# Patient Record
Sex: Male | Born: 1990 | Race: White | Hispanic: No | Marital: Single | Smoking: Current every day smoker
Health system: Southern US, Community
[De-identification: ages and names within clinical notes are randomized; demographics above are authoritative.]

## PROBLEM LIST (undated history)

## (undated) DIAGNOSIS — F1010 Alcohol abuse, uncomplicated: Secondary | ICD-10-CM

## (undated) DIAGNOSIS — B182 Chronic viral hepatitis C: Secondary | ICD-10-CM

## (undated) DIAGNOSIS — M549 Dorsalgia, unspecified: Secondary | ICD-10-CM

## (undated) HISTORY — PX: NO PAST SURGERIES: SHX2092

## (undated) HISTORY — DX: Chronic viral hepatitis C: B18.2

---

## 2012-08-25 ENCOUNTER — Emergency Department (HOSPITAL_COMMUNITY)
Admission: EM | Admit: 2012-08-25 | Discharge: 2012-08-25 | Disposition: A | Discharge status: Home / Self Care | Payer: Self-pay | Attending: Emergency Medicine | Admitting: Emergency Medicine

## 2012-08-25 ENCOUNTER — Encounter (HOSPITAL_COMMUNITY): Payer: Self-pay | Admitting: Physical Medicine and Rehabilitation

## 2012-08-25 DIAGNOSIS — F17200 Nicotine dependence, unspecified, uncomplicated: Secondary | ICD-10-CM | POA: Insufficient documentation

## 2012-08-25 DIAGNOSIS — M545 Low back pain, unspecified: Secondary | ICD-10-CM | POA: Insufficient documentation

## 2012-08-25 DIAGNOSIS — Z79899 Other long term (current) drug therapy: Secondary | ICD-10-CM | POA: Insufficient documentation

## 2012-08-25 HISTORY — DX: Dorsalgia, unspecified: M54.9

## 2012-08-25 MED ORDER — METHOCARBAMOL 500 MG PO TABS: 500.0000 mg | ORAL_TABLET | Freq: Two times a day (BID) | ORAL | Status: DC

## 2012-08-25 MED ORDER — NAPROXEN 500 MG PO TABS: 500.0000 mg | ORAL_TABLET | Freq: Two times a day (BID) | ORAL | Status: DC

## 2012-08-25 MED ORDER — OXYCODONE-ACETAMINOPHEN 5-325 MG PO TABS: 1.0000 | ORAL_TABLET | Freq: Four times a day (QID) | ORAL | Status: DC | PRN

## 2012-08-25 NOTE — ED Provider Notes (Signed)
Medical screening examination/treatment/procedure(s) were performed by non-physician practitioner and as supervising physician I was immediately available for consultation/collaboration.   Charles B. Sheldon, MD 08/25/12 1345 

## 2012-08-25 NOTE — ED Notes (Addendum)
Patient states that he has been having shoulder pain that radiates down R side and into thigh area. Patient states he has been using ibuprofen/aleve/tylenol to relieve pain, but nothing has worked thus far.   Patient states that he has been trying both ice/heat to help with little relief of pain.

## 2012-08-25 NOTE — ED Notes (Signed)
Pt states pain to R middle back radiating up to shoulder and down to R hip/thigh. States chronic pain with back. States recent heavy lifting, thinks he could of strained muscles. 10/10 pain at the time. No obvious deformities noted. Pt is conscious alert and oriented x4.

## 2012-08-25 NOTE — ED Provider Notes (Signed)
CSN: [id number]     Arrival date & time 08/25/12  1055 History     First MD Initiated Contact with Patient 08/25/12 1114     Chief Complaint  Patient presents with  . Back Pain   (Consider location/radiation/quality/duration/timing/severity/associated sxs/prior Treatment) HPI Comments: Patient presents with lower back pain.  He reports that the pain has been recurrent over the past year.  Pain radiates to the right hip and down the right leg.  He reports that he does a lot of heavy lifting at work.  He reports that at times he feels the muscles on the right side of his back spasm.  He has been evaluated by a Chiropractor for this in the past and had "adjustments" performed.  He has been taking Ibuprofen and Aleve at home without relief.  Patient is a 21 y.o. male presenting with back pain. The history is provided by the patient.  Back Pain Worsened by:  Bending and ambulation Associated symptoms: no abdominal pain, no bladder incontinence, no bowel incontinence, no dysuria, no fever, no numbness, no perianal numbness, no tingling and no weakness     Past Medical History  Diagnosis Date  . Back pain    No past surgical history on file. History reviewed. No pertinent family history. History  Substance Use Topics  . Smoking status: Current Every Day Smoker    Types: Cigarettes  . Smokeless tobacco: Not on file  . Alcohol Use: Yes     Comment: social    Review of Systems  Constitutional: Negative for fever.  Gastrointestinal: Negative for abdominal pain and bowel incontinence.  Genitourinary: Negative for bladder incontinence and dysuria.  Musculoskeletal: Positive for back pain.  Neurological: Negative for tingling, weakness and numbness.  All other systems reviewed and are negative.    Allergies  Review of patient's allergies indicates no known allergies.  Home Medications   Current Outpatient Rx  Name  Route  Sig  Dispense  Refill  . acetaminophen (TYLENOL) 500 MG  tablet   Oral   Take 500 mg by mouth every 6 (six) hours as needed for pain.          Marland Kitchen ibuprofen (ADVIL,MOTRIN) 200 MG tablet   Oral   Take 400 mg by mouth every 6 (six) hours as needed for pain.          . Menthol, Topical Analgesic, (BIOFREEZE EX)   Apply externally   Apply 1 application topically daily as needed (pain).         Marland Kitchen menthol-zinc oxide (GOLD BOND) powder   Topical   Apply 1 application topically 2 (two) times daily as needed (pain).         . Multiple Vitamin (MULTIVITAMIN WITH MINERALS) TABS   Oral   Take 1 tablet by mouth daily.         . naproxen sodium (ANAPROX) 220 MG tablet   Oral   Take 220 mg by mouth 2 (two) times daily as needed (pain).           BP 117/69  Pulse 89  Temp(Src) 98.4 F (36.9 C)  Resp 18  Ht 6\' 1"  (1.854 m)  Wt 200 lb (90.719 kg)  BMI 26.39 kg/m2  SpO2 100% Physical Exam  Nursing note and vitals reviewed. Constitutional: He is oriented to person, place, and time. He appears well-developed and well-nourished. No distress.  HENT:  Head: Normocephalic and atraumatic.  Neck: Normal range of motion and full passive range of motion without  pain. Neck supple. No spinous process tenderness and no muscular tenderness present. No rigidity. Normal range of motion present.  Cardiovascular: Normal rate, regular rhythm, normal heart sounds and intact distal pulses.   Pulmonary/Chest: Effort normal and breath sounds normal.  Musculoskeletal:       Cervical back: He exhibits normal range of motion, no tenderness, no bony tenderness and no pain.       Thoracic back: He exhibits no tenderness, no bony tenderness and no pain.       Lumbar back: He exhibits tenderness, bony tenderness and pain. He exhibits no spasm and normal pulse.  Bilateral lower extremities nontender without color change, baseline range of motion of extremities with intact distal pulses.  Pt has increased pain w ROM of lumbar spine. Pain w ambulation, no sign of  ataxia.  Neurological: He is alert and oriented to person, place, and time. He has normal strength and normal reflexes. No sensory deficit. Gait (no ataxia, slowed and hunched d/t pain ) abnormal.  Sensation at baseline for light touch in all 4 distal extremities, motor symmetric & bilateral 5/5 (hips: abduction, adduction, flexion; knee: flexion & extension; foot: dorsiflexion, plantar flexion, toes: dorsi flexion) Patellar & ankle reflexes intact.   Skin: Skin is warm and dry. No rash noted. He is not diaphoretic. No erythema. No pallor.  Psychiatric: He has a normal mood and affect.    ED Course   Procedures (including critical care time)  Labs Reviewed - No data to display No results found. No diagnosis found.  MDM  Patient with back pain.  Pain appears to be muscular.  No neurological deficits and normal neuro exam.  Patient can walk but states is painful.  No loss of bowel or bladder control.  No concern for cauda equina.  No fever, night sweats, weight loss, h/o cancer, IVDU.  RICE protocol and pain medicine indicated and discussed with patient.    Pascal Lux [place], PA-C 08/25/12 1210

## 2012-09-02 ENCOUNTER — Emergency Department (HOSPITAL_COMMUNITY)
Admission: EM | Admit: 2012-09-02 | Discharge: 2012-09-02 | Discharge status: Against Medical Advice | Payer: Self-pay | Attending: Emergency Medicine | Admitting: Emergency Medicine

## 2012-09-02 ENCOUNTER — Encounter (HOSPITAL_COMMUNITY): Payer: Self-pay | Admitting: Emergency Medicine

## 2012-09-02 DIAGNOSIS — S4991XA Unspecified injury of right shoulder and upper arm, initial encounter: Secondary | ICD-10-CM

## 2012-09-02 DIAGNOSIS — X58XXXA Exposure to other specified factors, initial encounter: Secondary | ICD-10-CM | POA: Insufficient documentation

## 2012-09-02 DIAGNOSIS — S46909A Unspecified injury of unspecified muscle, fascia and tendon at shoulder and upper arm level, unspecified arm, initial encounter: Secondary | ICD-10-CM | POA: Insufficient documentation

## 2012-09-02 DIAGNOSIS — Y939 Activity, unspecified: Secondary | ICD-10-CM | POA: Insufficient documentation

## 2012-09-02 DIAGNOSIS — S4980XA Other specified injuries of shoulder and upper arm, unspecified arm, initial encounter: Secondary | ICD-10-CM | POA: Insufficient documentation

## 2012-09-02 DIAGNOSIS — Y929 Unspecified place or not applicable: Secondary | ICD-10-CM | POA: Insufficient documentation

## 2012-09-02 NOTE — ED Notes (Signed)
Pt leaving AMA after [employer] questioned and obtained pt's real name and DOB.

## 2012-09-02 NOTE — ED Notes (Signed)
Stephen Mcpherson    07-Oct-1990.   Pt's real name per [employer].

## 2012-09-02 NOTE — ED Notes (Signed)
States trying to move refrigerator and it fell on top of him, now having right shoulder pain and right back pain. States this happened two days ago; has tried gold bond creams, heat and ice with no relief to the shoulder.

## 2012-09-02 NOTE — ED Provider Notes (Signed)
Pt evaluated by triage and would not give his name. [employer] was sent in to question him. He was asked to give a urine sample. Pt gave a false name and decided to leave AMA this was prior to my examination although I did have a chance to briefly interview the patient. Vital signs stable, right shoulder injury from a few days ago.     Dorthula Matas, PA-C 09/02/12 1828

## 2012-09-03 NOTE — ED Provider Notes (Signed)
Medical screening examination/treatment/procedure(s) were performed by non-physician practitioner and as supervising physician I was immediately available for consultation/collaboration.   Travius Crochet H Shellia Hartl, MD 09/03/12 0005 

## 2012-09-18 ENCOUNTER — Encounter (HOSPITAL_COMMUNITY): Payer: Self-pay | Admitting: Physical Medicine and Rehabilitation

## 2014-09-13 ENCOUNTER — Encounter (HOSPITAL_COMMUNITY): Payer: Self-pay | Admitting: *Deleted

## 2014-09-13 ENCOUNTER — Emergency Department (HOSPITAL_COMMUNITY)
Admission: EM | Admit: 2014-09-13 | Discharge: 2014-09-14 | Disposition: A | Discharge status: Home / Self Care | Payer: Self-pay | Attending: Emergency Medicine | Admitting: Emergency Medicine

## 2014-09-13 DIAGNOSIS — K2970 Gastritis, unspecified, without bleeding: Secondary | ICD-10-CM | POA: Insufficient documentation

## 2014-09-13 DIAGNOSIS — R112 Nausea with vomiting, unspecified: Secondary | ICD-10-CM

## 2014-09-13 DIAGNOSIS — Z720 Tobacco use: Secondary | ICD-10-CM | POA: Insufficient documentation

## 2014-09-13 DIAGNOSIS — E860 Dehydration: Secondary | ICD-10-CM | POA: Insufficient documentation

## 2014-09-13 DIAGNOSIS — F1010 Alcohol abuse, uncomplicated: Secondary | ICD-10-CM | POA: Insufficient documentation

## 2014-09-13 DIAGNOSIS — R34 Anuria and oliguria: Secondary | ICD-10-CM | POA: Insufficient documentation

## 2014-09-13 LAB — CBC WITH DIFFERENTIAL/PLATELET
BASOS ABS: 0 10*3/uL (ref 0.0–0.1)
BASOS PCT: 0 % (ref 0–1)
EOS ABS: 0 10*3/uL (ref 0.0–0.7)
Eosinophils Relative: 0 % (ref 0–5)
HEMATOCRIT: 49.5 % (ref 39.0–52.0)
HEMOGLOBIN: 17.8 g/dL — AB (ref 13.0–17.0)
Lymphocytes Relative: 4 % — ABNORMAL LOW (ref 12–46)
Lymphs Abs: 0.6 10*3/uL — ABNORMAL LOW (ref 0.7–4.0)
MCH: 34.2 pg — ABNORMAL HIGH (ref 26.0–34.0)
MCHC: 36 g/dL (ref 30.0–36.0)
MCV: 95 fL (ref 78.0–100.0)
MONOS PCT: 7 % (ref 3–12)
Monocytes Absolute: 1 10*3/uL (ref 0.1–1.0)
NEUTROS ABS: 13 10*3/uL — AB (ref 1.7–7.7)
NEUTROS PCT: 89 % — AB (ref 43–77)
Platelets: 180 10*3/uL (ref 150–400)
RBC: 5.21 MIL/uL (ref 4.22–5.81)
RDW: 13.2 % (ref 11.5–15.5)
WBC: 14.6 10*3/uL — ABNORMAL HIGH (ref 4.0–10.5)

## 2014-09-13 LAB — COMPREHENSIVE METABOLIC PANEL
ALBUMIN: 5 g/dL (ref 3.5–5.0)
ALK PHOS: 49 U/L (ref 38–126)
ALT: 31 U/L (ref 17–63)
ANION GAP: 17 — AB (ref 5–15)
AST: 37 U/L (ref 15–41)
BILIRUBIN TOTAL: 1.9 mg/dL — AB (ref 0.3–1.2)
BUN: 12 mg/dL (ref 6–20)
CO2: 21 mmol/L — AB (ref 22–32)
Calcium: 10.4 mg/dL — ABNORMAL HIGH (ref 8.9–10.3)
Chloride: 102 mmol/L (ref 101–111)
Creatinine, Ser: 1.17 mg/dL (ref 0.61–1.24)
GFR calc Af Amer: >60 mL/min (ref >60)
GFR calc non Af Amer: >60 mL/min (ref >60)
GLUCOSE: 76 mg/dL (ref 65–99)
POTASSIUM: 4.1 mmol/L (ref 3.5–5.1)
SODIUM: 140 mmol/L (ref 135–145)
Total Protein: 8.6 g/dL — ABNORMAL HIGH (ref 6.5–8.1)

## 2014-09-13 LAB — ETHANOL: Alcohol, Ethyl (B): <5 mg/dL (ref <5)

## 2014-09-13 LAB — LIPASE, BLOOD: Lipase: 15 U/L — ABNORMAL LOW (ref 22–51)

## 2014-09-13 MED ORDER — SODIUM CHLORIDE 0.9 % IV BOLUS (SEPSIS)
1000.0000 mL | Freq: Once | INTRAVENOUS | Status: AC
  Administered 2014-09-13: 1000 mL via INTRAVENOUS

## 2014-09-13 MED ORDER — ONDANSETRON HCL 4 MG/2ML IJ SOLN
4.0000 mg | Freq: Once | INTRAMUSCULAR | Status: AC
  Administered 2014-09-13: 4 mg via INTRAVENOUS
  Filled 2014-09-13: qty 2

## 2014-09-13 NOTE — ED Notes (Signed)
Pt states he has been drinking alcohol for about 2.5 days. Pt c/o n/v x 6hrs. Pt states he is seeing "coffee grounds." Pt states he is going to start going to get help at [facility]. Pt doesn't want help getting off alcohol from [place].

## 2014-09-14 LAB — URINALYSIS, ROUTINE W REFLEX MICROSCOPIC
Glucose, UA: NEGATIVE mg/dL
Hgb urine dipstick: NEGATIVE
Ketones, ur: >80 mg/dL — AB
LEUKOCYTES UA: NEGATIVE
NITRITE: NEGATIVE
PROTEIN: NEGATIVE mg/dL
SPECIFIC GRAVITY, URINE: 1.025 (ref 1.005–1.030)
UROBILINOGEN UA: 0.2 mg/dL (ref 0.0–1.0)
pH: 6 (ref 5.0–8.0)

## 2014-09-14 MED ORDER — PANTOPRAZOLE SODIUM 40 MG PO TBEC
40.0000 mg | DELAYED_RELEASE_TABLET | Freq: Once | ORAL | Status: AC
  Administered 2014-09-14: 40 mg via ORAL
  Filled 2014-09-14: qty 1

## 2014-09-14 MED ORDER — SODIUM CHLORIDE 0.9 % IV BOLUS (SEPSIS)
1000.0000 mL | Freq: Once | INTRAVENOUS | Status: AC
  Administered 2014-09-14: 1000 mL via INTRAVENOUS

## 2014-09-14 MED ORDER — PANTOPRAZOLE SODIUM 20 MG PO TBEC: 20.0000 mg | DELAYED_RELEASE_TABLET | Freq: Every day | ORAL | Status: DC

## 2014-09-14 MED ORDER — PROMETHAZINE HCL 25 MG PO TABS: 25.0000 mg | ORAL_TABLET | Freq: Four times a day (QID) | ORAL | Status: DC | PRN

## 2014-09-14 NOTE — Discharge Instructions (Signed)
Nausea and Vomiting °Nausea is a sick feeling that often comes before throwing up (vomiting). Vomiting is a reflex where stomach contents come out of your mouth. Vomiting can cause severe loss of body fluids (dehydration). Children and elderly adults can become dehydrated quickly, especially if they also have diarrhea. Nausea and vomiting are symptoms of a condition or disease. It is important to find the cause of your symptoms. °CAUSES  °· Direct irritation of the stomach lining. This irritation can result from increased acid production (gastroesophageal reflux disease), infection, food poisoning, taking certain medicines (such as nonsteroidal anti-inflammatory drugs), alcohol use, or tobacco use. °· Signals from the brain. These signals could be caused by a headache, heat exposure, an inner ear disturbance, increased pressure in the brain from injury, infection, a tumor, or a concussion, pain, emotional stimulus, or metabolic problems. °· An obstruction in the gastrointestinal tract (bowel obstruction). °· Illnesses such as diabetes, hepatitis, gallbladder problems, appendicitis, kidney problems, cancer, sepsis, atypical symptoms of a heart attack, or eating disorders. °· Medical treatments such as chemotherapy and radiation. °· Receiving medicine that makes you sleep (general anesthetic) during surgery. °DIAGNOSIS °Your caregiver may ask for tests to be done if the problems do not improve after a few days. Tests may also be done if symptoms are severe or if the reason for the nausea and vomiting is not clear. Tests may include: °· Urine tests. °· Blood tests. °· Stool tests. °· Cultures (to look for evidence of infection). °· X-rays or other imaging studies. °Test results can help your caregiver make decisions about treatment or the need for additional tests. °TREATMENT °You need to stay well hydrated. Drink frequently but in small amounts. You may wish to drink water, sports drinks, clear broth, or eat frozen  ice pops or gelatin dessert to help stay hydrated. When you eat, eating slowly may help prevent nausea. There are also some antinausea medicines that may help prevent nausea. °HOME CARE INSTRUCTIONS  °· Take all medicine as directed by your caregiver. °· If you do not have an appetite, do not force yourself to eat. However, you must continue to drink fluids. °· If you have an appetite, eat a normal diet unless your caregiver tells you differently. °¨ Eat a variety of complex carbohydrates (rice, wheat, potatoes, bread), lean meats, yogurt, fruits, and vegetables. °¨ Avoid high-fat foods because they are more difficult to digest. °· Drink enough water and fluids to keep your urine clear or pale yellow. °· If you are dehydrated, ask your caregiver for specific rehydration instructions. Signs of dehydration may include: °¨ Severe thirst. °¨ Dry lips and mouth. °¨ Dizziness. °¨ Dark urine. °¨ Decreasing urine frequency and amount. °¨ Confusion. °¨ Rapid breathing or pulse. °SEEK IMMEDIATE MEDICAL CARE IF:  °· You have blood or brown flecks (like coffee grounds) in your vomit. °· You have black or bloody stools. °· You have a severe headache or stiff neck. °· You are confused. °· You have severe abdominal pain. °· You have chest pain or trouble breathing. °· You do not urinate at least once every 8 hours. °· You develop cold or clammy skin. °· You continue to vomit for longer than 24 to 48 hours. °· You have a fever. °MAKE SURE YOU:  °· Understand these instructions. °· Will watch your condition. °· Will get help right away if you are not doing well or get worse. °Document Released: 01/10/2005 Document Revised: 04/04/2011 Document Reviewed: 06/09/2010 °ExitCare® Patient Information ©2015 [employer]. This information is not intended   to replace advice given to you by your health care provider. Make sure you discuss any questions you have with your health care provider.  Alcohol Use Disorder Alcohol use disorder is a  mental disorder. It is not a one-time incident of heavy drinking. Alcohol use disorder is the excessive and uncontrollable use of alcohol over time that leads to problems with functioning in one or more areas of daily living. People with this disorder risk harming themselves and others when they drink to excess. Alcohol use disorder also can cause other mental disorders, such as mood and anxiety disorders, and serious physical problems. People with alcohol use disorder often misuse other drugs.  Alcohol use disorder is common and widespread. Some people with this disorder drink alcohol to cope with or escape from negative life events. Others drink to relieve chronic pain or symptoms of mental illness. People with a family history of alcohol use disorder are at higher risk of losing control and using alcohol to excess.  SYMPTOMS  Signs and symptoms of alcohol use disorder may include the following:   Consumption ofalcohol inlarger amounts or over a longer period of time than intended.  Multiple unsuccessful attempts to cutdown or control alcohol use.   A great deal of time spent obtaining alcohol, using alcohol, or recovering from the effects of alcohol (hangover).  A strong desire or urge to use alcohol (cravings).   Continued use of alcohol despite problems at work, school, or home because of alcohol use.   Continued use of alcohol despite problems in relationships because of alcohol use.  Continued use of alcohol in situations when it is physically hazardous, such as driving a car.  Continued use of alcohol despite awareness of a physical or psychological problem that is likely related to alcohol use. Physical problems related to alcohol use can involve the brain, heart, liver, stomach, and intestines. Psychological problems related to alcohol use include intoxication, depression, anxiety, psychosis, delirium, and dementia.   The need for increased amounts of alcohol to achieve the same  desired effect, or a decreased effect from the consumption of the same amount of alcohol (tolerance).  Withdrawal symptoms upon reducing or stopping alcohol use, or alcohol use to reduce or avoid withdrawal symptoms. Withdrawal symptoms include:  Racing heart.  Hand tremor.  Difficulty sleeping.  Nausea.  Vomiting.  Hallucinations.  Restlessness.  Seizures. DIAGNOSIS Alcohol use disorder is diagnosed through an assessment by your health care provider. Your health care provider may start by asking three or four questions to screen for excessive or problematic alcohol use. To confirm a diagnosis of alcohol use disorder, at least two symptoms must be present within a 11-month period. The severity of alcohol use disorder depends on the number of symptoms:  Mild--two or three.  Moderate--four or five.  Severe--six or more. Your health care provider may perform a physical exam or use results from lab tests to see if you have physical problems resulting from alcohol use. Your health care provider may refer you to a mental health professional for evaluation. TREATMENT  Some people with alcohol use disorder are able to reduce their alcohol use to low-risk levels. Some people with alcohol use disorder need to quit drinking alcohol. When necessary, mental health professionals with specialized training in substance use treatment can help. Your health care provider can help you decide how severe your alcohol use disorder is and what type of treatment you need. The following forms of treatment are available:   Detoxification. Detoxification involves  the use of prescription medicines to prevent alcohol withdrawal symptoms in the first week after quitting. This is important for people with a history of symptoms of withdrawal and for heavy drinkers who are likely to have withdrawal symptoms. Alcohol withdrawal can be dangerous and, in severe cases, cause death. Detoxification is usually provided in a  hospital or in-patient substance use treatment facility.  Counseling or talk therapy. Talk therapy is provided by substance use treatment counselors. It addresses the reasons people use alcohol and ways to keep them from drinking again. The goals of talk therapy are to help people with alcohol use disorder find healthy activities and ways to cope with life stress, to identify and avoid triggers for alcohol use, and to handle cravings, which can cause relapse.  Medicines.Different medicines can help treat alcohol use disorder through the following actions:  Decrease alcohol cravings.  Decrease the positive reward response felt from alcohol use.  Produce an uncomfortable physical reaction when alcohol is used (aversion therapy).  Support groups. Support groups are run by people who have quit drinking. They provide emotional support, advice, and guidance. These forms of treatment are often combined. Some people with alcohol use disorder benefit from intensive combination treatment provided by specialized substance use treatment centers. Both inpatient and outpatient treatment programs are available. Document Released: 02/18/2004 Document Revised: 05/27/2013 Document Reviewed: 04/19/2012 [facility] Patient Information 2015 [place], [place]. This information is not intended to replace advice given to you by your health care provider. Make sure you discuss any questions you have with your health care provider.   Behavioral Health Resources in the [facility]  Intensive Outpatient Programs: [facility]      [address] [place], [place] [phone] Both a day and evening program       [facility] Outpatient     [address]        [place], [place] [address] [phone]         [facility] [address] [place] [phone]  [facility] Mental Health ACCESS LINE: [phone] or [phone] [address] [place], [place] [address] [email or web address]  Mobile Crisis Teams:                                        [facility]         Mobile Crisis Care Unit [phone]             [facility] [address] Ginette Otto [phone]                                         [facility] [address] [place] [phone]  Self-Help/Support Groups: [facility] [employer] of support groups [phone] (call for more info)  Narcotics Anonymous (NA) [facility] [address] [place] - 2 meetings at this location  Residential Treatment Programs:  [facility]      [address]        [place]       [phone]         [facility] [address], [place] [address] [place], [place]  [address] [phone]  [facility]  [address] [place]  [place], [place] [address] [phone] Admissions: 8am-3pm M-F  [facility]     [address]        [place], [place] [address]       [phone]         [facility] [address] [place], [place] [phone]  [facility] [address] [place], [place] [phone]  [facility] - Intensive Outpatient      [address]     [place], [place]       [phone]         [facility] ([facility]     [address] [place], [place] [phone] or [phone]  [facility] ([facility])  [address] [place], [place] [phone]  [facility] [address]                                               [address] [place] [phone]  [facility] Resources: [phone]               General Therapy                                                Angie Fava, PhD        [address] [place], [place] [address]         [phone]   Insurance  [facility]   [address] [place], [place] [address] [phone]  [facility] [address] [place], [place] [address] [phone] Insurance/Medicaid/sponsorship through [facility]                                              [address]                                        [place], [place] [address]    Therapy/tele-psych/case         [phone]          [facility] [address], [place]  [address]  Adolescent/group home/case management [phone]                                           Creola Corn PhD       General therapy       Insurance   [phone]         Dr. Lolly Mustache Insurance [phone] M-F  [facility] Medicaid, sponsorship [phone]

## 2014-09-14 NOTE — ED Provider Notes (Signed)
CSN: [id number]     Arrival date & time 09/13/14  2036 History   First MD Initiated Contact with Patient 09/13/14 2147     Chief Complaint  Patient presents with  . Emesis     (Consider location/radiation/quality/duration/timing/severity/associated sxs/prior Treatment) The history is provided by the patient.   Stephen Mcpherson is a 24 y.o. male who presents for evaluation of intractable vomiting and reporting coffee-ground emesis without seen bright red blood, symptoms started around 1 PM this afternoon.  He has a history of binge drinking and reports has had essentially no intake except for alcohol for the past 2 and half days, last intake was last night.  His been unable to tolerate any by mouth intake today.  He reports binging in similar fashion every several weeks since the beginning of the year.  He reports generalized headache and fatigue, denies dizziness or focal weakness.  He has had no diarrhea, last bowel movement was yesterday and normal and has had no blood in his stool.  He does endorse decreased urinary frequency today.    Past Medical History  Diagnosis Date  . Back pain    History reviewed. No pertinent past surgical history. History reviewed. No pertinent family history. Social History  Substance Use Topics  . Smoking status: Current Every Day Smoker -- 0.50 packs/day    Types: Cigarettes  . Smokeless tobacco: None  . Alcohol Use: Yes     Comment: social    Review of Systems  Constitutional: Positive for fatigue. Negative for fever.  HENT: Negative for congestion and sore throat.   Eyes: Negative.   Respiratory: Negative for chest tightness and shortness of breath.   Cardiovascular: Negative for chest pain.  Gastrointestinal: Positive for nausea and vomiting. Negative for abdominal pain and blood in stool.  Genitourinary: Positive for decreased urine volume. Negative for frequency.  Musculoskeletal: Negative for joint swelling, arthralgias and neck pain.   Skin: Negative.  Negative for rash and wound.  Neurological: Negative for dizziness, weakness, light-headedness, numbness and headaches.  Psychiatric/Behavioral: Negative.       Allergies  Review of patient's allergies indicates no known allergies.  Home Medications   Prior to Admission medications   Medication Sig Start Date End Date Taking? Authorizing Provider  acetaminophen (TYLENOL) 500 MG tablet Take 500 mg by mouth every 6 (six) hours as needed for pain.    Yes Historical Provider, MD  pantoprazole (PROTONIX) 20 MG tablet Take 1 tablet (20 mg total) by mouth daily. 09/14/14   Burgess Amor, PA-C  promethazine (PHENERGAN) 25 MG tablet Take 1 tablet (25 mg total) by mouth every 6 (six) hours as needed for nausea or vomiting. 09/14/14   Burgess Amor, PA-C   BP 122/76 mmHg  Pulse 75  Temp(Src) 97.8 F (36.6 C) (Oral)  Resp 20  Ht 6\' 1"  (1.854 m)  Wt 200 lb (90.719 kg)  BMI 26.39 kg/m2  SpO2 100% Physical Exam  Constitutional: He appears well-developed and well-nourished.  HENT:  Head: Normocephalic and atraumatic.  Eyes: Conjunctivae are normal.  Neck: Normal range of motion.  Cardiovascular: Normal rate, regular rhythm, normal heart sounds and intact distal pulses.   Pulmonary/Chest: Effort normal and breath sounds normal. He has no wheezes.  Abdominal: Soft. Bowel sounds are normal. He exhibits no distension. There is tenderness in the epigastric area. There is no guarding.  Musculoskeletal: Normal range of motion.  Neurological: He is alert.  Skin: Skin is warm and dry.  Psychiatric: He has a  normal mood and affect.  Nursing note and vitals reviewed.   ED Course  Procedures (including critical care time) Labs Review Labs Reviewed  CBC WITH DIFFERENTIAL/PLATELET - Abnormal; Notable for the following:    WBC 14.6 (*)    Hemoglobin 17.8 (*)    MCH 34.2 (*)    Neutrophils Relative % 89 (*)    Neutro Abs 13.0 (*)    Lymphocytes Relative 4 (*)    Lymphs Abs 0.6 (*)     All other components within normal limits  COMPREHENSIVE METABOLIC PANEL - Abnormal; Notable for the following:    CO2 21 (*)    Calcium 10.4 (*)    Total Protein 8.6 (*)    Total Bilirubin 1.9 (*)    Anion gap 17 (*)    All other components within normal limits  LIPASE, BLOOD - Abnormal; Notable for the following:    Lipase 15 (*)    All other components within normal limits  URINALYSIS, ROUTINE W REFLEX MICROSCOPIC (NOT AT [facility]) - Abnormal; Notable for the following:    Bilirubin Urine SMALL (*)    Ketones, ur >80 (*)    All other components within normal limits  ETHANOL    Imaging Review No results found.  EKG Interpretation None      MDM   Final diagnoses:  ETOH abuse  Dehydration  Non-intractable vomiting with nausea, vomiting of unspecified type  Gastritis    Patients labs reviewed, interpreted and considered during the medical decision making and disposition process.  Results were also discussed with patient.   Pt was given IV fluids 2 L and also maintained PO fluid intake.  Protonix given, zofran given.  Reexam with no acute abdominal findings, epigastric discomfort improved.  He states he feels hungry at this time.  Also discussed resources for assistance with alcohol abuse.  Patient is interested in taking this step and resources were provided him.  He was prescribed Protonix.  Referrals also given for obtaining PCP.  Parent follow-up anticipated here.  The patient appears reasonably screened and/or stabilized for discharge and I doubt any other medical condition or other [facility] requiring further screening, evaluation, or treatment in the ED at this time prior to discharge.      Burgess Amor, PA-C 09/14/14 0117  Burgess Amor, PA-C 09/14/14 0123  Benjiman Core, MD 09/14/14 1535

## 2015-05-01 ENCOUNTER — Encounter (HOSPITAL_BASED_OUTPATIENT_CLINIC_OR_DEPARTMENT_OTHER): Payer: Self-pay | Admitting: *Deleted

## 2015-05-01 ENCOUNTER — Emergency Department (HOSPITAL_BASED_OUTPATIENT_CLINIC_OR_DEPARTMENT_OTHER): Payer: Self-pay

## 2015-05-01 ENCOUNTER — Emergency Department (HOSPITAL_BASED_OUTPATIENT_CLINIC_OR_DEPARTMENT_OTHER)
Admission: EM | Admit: 2015-05-01 | Discharge: 2015-05-01 | Disposition: A | Discharge status: Home / Self Care | Payer: Self-pay | Attending: Emergency Medicine | Admitting: Emergency Medicine

## 2015-05-01 DIAGNOSIS — X58XXXA Exposure to other specified factors, initial encounter: Secondary | ICD-10-CM | POA: Insufficient documentation

## 2015-05-01 DIAGNOSIS — Y998 Other external cause status: Secondary | ICD-10-CM | POA: Insufficient documentation

## 2015-05-01 DIAGNOSIS — Y9289 Other specified places as the place of occurrence of the external cause: Secondary | ICD-10-CM | POA: Insufficient documentation

## 2015-05-01 DIAGNOSIS — R569 Unspecified convulsions: Secondary | ICD-10-CM | POA: Insufficient documentation

## 2015-05-01 DIAGNOSIS — R51 Headache: Secondary | ICD-10-CM | POA: Insufficient documentation

## 2015-05-01 DIAGNOSIS — F17210 Nicotine dependence, cigarettes, uncomplicated: Secondary | ICD-10-CM | POA: Insufficient documentation

## 2015-05-01 DIAGNOSIS — Y9389 Activity, other specified: Secondary | ICD-10-CM | POA: Insufficient documentation

## 2015-05-01 DIAGNOSIS — S01552A Open bite of oral cavity, initial encounter: Secondary | ICD-10-CM | POA: Insufficient documentation

## 2015-05-01 HISTORY — DX: Alcohol abuse, uncomplicated: F10.10

## 2015-05-01 LAB — URINALYSIS, ROUTINE W REFLEX MICROSCOPIC
Bilirubin Urine: NEGATIVE
Glucose, UA: NEGATIVE mg/dL
Hgb urine dipstick: NEGATIVE
KETONES UR: 15 mg/dL — AB
Leukocytes, UA: NEGATIVE
NITRITE: NEGATIVE
Protein, ur: 30 mg/dL — AB
SPECIFIC GRAVITY, URINE: 1.017 (ref 1.005–1.030)
pH: 7 (ref 5.0–8.0)

## 2015-05-01 LAB — I-STAT VENOUS BLOOD GAS, ED
ACID-BASE DEFICIT: 2 mmol/L (ref 0.0–2.0)
BICARBONATE: 21 meq/L (ref 20.0–24.0)
O2 SAT: 98 %
PCO2 VEN: 29.3 mmHg — AB (ref 45.0–50.0)
PH VEN: 7.462 — AB (ref 7.250–7.300)
PO2 VEN: 87 mmHg — AB (ref 31.0–45.0)
Patient temperature: 97.8
TCO2: 22 mmol/L (ref 0–100)

## 2015-05-01 LAB — BASIC METABOLIC PANEL
Anion gap: >20 — ABNORMAL HIGH (ref 5–15)
Anion gap: 9 (ref 5–15)
BUN: 8 mg/dL (ref 6–20)
BUN: 6 mg/dL (ref 6–20)
CHLORIDE: 112 mmol/L — AB (ref 101–111)
CO2: 11 mmol/L — ABNORMAL LOW (ref 22–32)
CO2: 22 mmol/L (ref 22–32)
CREATININE: 1.22 mg/dL (ref 0.61–1.24)
Calcium: 9.5 mg/dL (ref 8.9–10.3)
Calcium: 8.4 mg/dL — ABNORMAL LOW (ref 8.9–10.3)
Chloride: 107 mmol/L (ref 101–111)
Creatinine, Ser: 0.8 mg/dL (ref 0.61–1.24)
GFR calc Af Amer: >60 mL/min (ref >60)
GFR calc Af Amer: >60 mL/min (ref >60)
GFR calc non Af Amer: >60 mL/min (ref >60)
Glucose, Bld: 128 mg/dL — ABNORMAL HIGH (ref 65–99)
Glucose, Bld: 101 mg/dL — ABNORMAL HIGH (ref 65–99)
POTASSIUM: 3.2 mmol/L — AB (ref 3.5–5.1)
POTASSIUM: 2.9 mmol/L — AB (ref 3.5–5.1)
SODIUM: 143 mmol/L (ref 135–145)
Sodium: 145 mmol/L (ref 135–145)

## 2015-05-01 LAB — RAPID URINE DRUG SCREEN, HOSP PERFORMED
AMPHETAMINES: POSITIVE — AB
Barbiturates: NOT DETECTED
Benzodiazepines: NOT DETECTED
Cocaine: NOT DETECTED
Opiates: NOT DETECTED
Tetrahydrocannabinol: POSITIVE — AB

## 2015-05-01 LAB — MAGNESIUM: MAGNESIUM: 2.4 mg/dL (ref 1.7–2.4)

## 2015-05-01 LAB — CBC
HCT: 51.4 % (ref 39.0–52.0)
Hemoglobin: 17 g/dL (ref 13.0–17.0)
MCH: 33.7 pg (ref 26.0–34.0)
MCHC: 33.1 g/dL (ref 30.0–36.0)
MCV: 102 fL — AB (ref 78.0–100.0)
PLATELETS: 201 10*3/uL (ref 150–400)
RBC: 5.04 MIL/uL (ref 4.22–5.81)
RDW: 14.9 % (ref 11.5–15.5)
WBC: 11.3 10*3/uL — AB (ref 4.0–10.5)

## 2015-05-01 LAB — URINE MICROSCOPIC-ADD ON: RBC / HPF: NONE SEEN RBC/hpf (ref 0–5)

## 2015-05-01 LAB — CBG MONITORING, ED: GLUCOSE-CAPILLARY: 123 mg/dL — AB (ref 65–99)

## 2015-05-01 MED ORDER — KETOROLAC TROMETHAMINE 30 MG/ML IJ SOLN
30.0000 mg | Freq: Once | INTRAMUSCULAR | Status: AC
  Administered 2015-05-01: 30 mg via INTRAVENOUS
  Filled 2015-05-01: qty 1

## 2015-05-01 MED ORDER — ONDANSETRON HCL 4 MG/2ML IJ SOLN
4.0000 mg | Freq: Once | INTRAMUSCULAR | Status: AC
  Administered 2015-05-01: 4 mg via INTRAVENOUS
  Filled 2015-05-01: qty 2

## 2015-05-01 MED ORDER — POTASSIUM CHLORIDE CRYS ER 20 MEQ PO TBCR: 20.0000 meq | EXTENDED_RELEASE_TABLET | Freq: Two times a day (BID) | ORAL | Status: DC

## 2015-05-01 MED ORDER — GI COCKTAIL ~~LOC~~: 30.0000 mL | Freq: Once | ORAL | Status: DC

## 2015-05-01 MED ORDER — SODIUM CHLORIDE 0.9 % IV SOLN: 2000.0000 mg | Freq: Once | INTRAVENOUS | Status: DC

## 2015-05-01 MED ORDER — GI COCKTAIL ~~LOC~~
30.0000 mL | Freq: Once | ORAL | Status: DC
Start: 2015-05-01 — End: 2015-05-02

## 2015-05-01 MED ORDER — SODIUM CHLORIDE 0.9 % IV BOLUS (SEPSIS)
2000.0000 mL | Freq: Once | INTRAVENOUS | Status: AC
  Administered 2015-05-01: 2000 mL via INTRAVENOUS

## 2015-05-01 MED ORDER — POTASSIUM CHLORIDE 10 MEQ/100ML IV SOLN
10.0000 meq | INTRAVENOUS | Status: AC
  Administered 2015-05-01 (×2): 10 meq via INTRAVENOUS
  Filled 2015-05-01 (×2): qty 100

## 2015-05-01 MED ORDER — RANITIDINE HCL 150 MG/10ML PO SYRP
150.0000 mg | ORAL_SOLUTION | Freq: Once | ORAL | Status: AC
  Administered 2015-05-01: 150 mg via ORAL
  Filled 2015-05-01: qty 10

## 2015-05-01 MED ORDER — POTASSIUM CHLORIDE CRYS ER 20 MEQ PO TBCR
40.0000 meq | EXTENDED_RELEASE_TABLET | Freq: Once | ORAL | Status: AC
  Administered 2015-05-01: 40 meq via ORAL
  Filled 2015-05-01: qty 2

## 2015-05-01 MED ORDER — LEVETIRACETAM 500 MG/5ML IV SOLN
INTRAVENOUS | Status: AC
  Administered 2015-05-01: 500 mg
  Filled 2015-05-01: qty 20

## 2015-05-01 MED ORDER — LORAZEPAM 2 MG/ML IJ SOLN
INTRAMUSCULAR | Status: AC
  Filled 2015-05-01: qty 1

## 2015-05-01 MED ORDER — LEVETIRACETAM 500 MG PO TABS: 500.0000 mg | ORAL_TABLET | Freq: Two times a day (BID) | ORAL | Status: DC

## 2015-05-01 NOTE — ED Notes (Signed)
Pt postictal on arrival to ED.  Pt combative.  Reports that pt had 2 witnessed seizures that lasted 1.5 minutes each en route to hospital.  States that pt had a migraine all morning.  Drank ETOH very heavily last night.
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
## 2015-05-01 NOTE — ED Provider Notes (Signed)
CSN: [id number]     Arrival date & time 05/01/15  1459 History   First MD Initiated Contact with Patient 05/01/15 1521     Chief Complaint  Patient presents with  . Seizures     (Consider location/radiation/quality/duration/timing/severity/associated sxs/prior Treatment) Patient is a 25 y.o. male presenting with seizures.  Seizures Seizure activity on arrival: no   Seizure type:  Grand mal Preceding symptoms: aura and headache   Initial focality:  None Episode characteristics: abnormal movements and unresponsiveness   Postictal symptoms: no confusion   Return to baseline: no   Severity:  Mild Duration:  2 minutes Timing:  Once Recent head injury:  No recent head injuries PTA treatment:  None History of seizures: yes     Past Medical History  Diagnosis Date  . Back pain   . ETOH abuse    History reviewed. No pertinent past surgical history. History reviewed. No pertinent family history. Social History  Substance Use Topics  . Smoking status: Current Every Day Smoker -- 0.50 packs/day    Types: Cigarettes  . Smokeless tobacco: None  . Alcohol Use: Yes     Comment: social    Review of Systems  Neurological: Positive for seizures and headaches. Negative for syncope and weakness.  All other systems reviewed and are negative.     Allergies  Review of patient's allergies indicates no known allergies.  Home Medications   Prior to Admission medications   Medication Sig Start Date End Date Taking? Authorizing Provider  levETIRAcetam (KEPPRA) 500 MG tablet Take 1 tablet (500 mg total) by mouth 2 (two) times daily. 05/01/15   Marily Memos, MD  potassium chloride SA (K-DUR,KLOR-CON) 20 MEQ tablet Take 1 tablet (20 mEq total) by mouth 2 (two) times daily. 05/01/15   Marily Memos, MD   BP 139/84 mmHg  Pulse 81  Temp(Src) 97.9 F (36.6 C) (Oral)  Resp 17  Ht 6' (1.829 m)  Wt 210 lb (95.255 kg)  BMI 28.47 kg/m2  SpO2 100% Physical Exam  Constitutional: He is oriented  to person, place, and time. He appears well-developed and well-nourished.  HENT:  Head: Normocephalic and atraumatic.  Lateral bite marks on tongue  Neck: Normal range of motion.  Cardiovascular: Normal rate.   Pulmonary/Chest: Effort normal. No respiratory distress.  Abdominal: Soft. He exhibits no distension.  Musculoskeletal: Normal range of motion. He exhibits no edema or tenderness.  Neurological: He is alert and oriented to person, place, and time. No cranial nerve deficit.  No altered mental status, able to give full seemingly accurate history.  Face is symmetric, EOM's intact, pupils equal and reactive, vision intact, tongue and uvula midline without deviation Upper and Lower extremity motor 5/5, intact pain perception in distal extremities, 2+ reflexes in biceps, patella and achilles tendons. Finger to nose normal, heel to shin normal.  Skin: Skin is warm and dry. No rash noted. No erythema.  Nursing note and vitals reviewed.   ED Course  Procedures (including critical care time) Labs Review Labs Reviewed  BASIC METABOLIC PANEL - Abnormal; Notable for the following:    Potassium 3.2 (*)    CO2 11 (*)    Glucose, Bld 128 (*)    Anion gap >20 (*)    All other components within normal limits  URINE RAPID DRUG SCREEN, HOSP PERFORMED - Abnormal; Notable for the following:    Amphetamines POSITIVE (*)    Tetrahydrocannabinol POSITIVE (*)    All other components within normal limits  URINALYSIS, ROUTINE  W REFLEX MICROSCOPIC (NOT AT [facility]) - Abnormal; Notable for the following:    Ketones, ur 15 (*)    Protein, ur 30 (*)    All other components within normal limits  CBC - Abnormal; Notable for the following:    WBC 11.3 (*)    MCV 102.0 (*)    All other components within normal limits  URINE MICROSCOPIC-ADD ON - Abnormal; Notable for the following:    Squamous Epithelial / LPF 0-5 (*)    Bacteria, UA FEW (*)    All other components within normal limits  BASIC METABOLIC  PANEL - Abnormal; Notable for the following:    Potassium 2.9 (*)    Chloride 112 (*)    Glucose, Bld 101 (*)    Calcium 8.4 (*)    All other components within normal limits  CBG MONITORING, ED - Abnormal; Notable for the following:    Glucose-Capillary 123 (*)    All other components within normal limits  I-STAT VENOUS BLOOD GAS, ED - Abnormal; Notable for the following:    pH, Ven 7.462 (*)    pCO2, Ven 29.3 (*)    pO2, Ven 87.0 (*)    All other components within normal limits  MAGNESIUM  CBG MONITORING, ED    Imaging Review Ct Head Wo Contrast  05/01/2015  CLINICAL DATA:  25 year old male with multiple seizures in the last 24 hours. Subsequent headache, dizziness and nausea. Personal history of seizure disorder, but no seizures for "Years" . Initial encounter. EXAM: CT HEAD WITHOUT CONTRAST TECHNIQUE: Contiguous axial images were obtained from the base of the skull through the vertex without intravenous contrast. COMPARISON:  None. FINDINGS: Visualized paranasal sinuses and mastoids are clear. No acute osseous abnormality identified. Visualized orbits and scalp soft tissues are within normal limits. Cerebral volume is normal. No midline shift, ventriculomegaly, mass effect, evidence of mass lesion, intracranial hemorrhage or evidence of cortically based acute infarction. Gray-white matter differentiation is within normal limits throughout the brain. Dominant distal left vertebral artery. No suspicious intracranial vascular hyperdensity. IMPRESSION: Normal non contrast appearance of the brain. No acute traumatic injury identified. Electronically Signed   By: Odessa FlemingH  Hall M.D.   On: 05/01/2015 16:02   I have personally reviewed and evaluated these images and lab results as part of my medical decision-making.   EKG Interpretation   Date/Time:  Friday May 01 2015 15:04:47 EDT Ventricular Rate:  102 PR Interval:  172 QRS Duration: 108 QT Interval:  387 QTC Calculation: 504 R Axis:    109 Text Interpretation:  Sinus tachycardia Borderline right axis deviation  Prolonged QT interval Confirmed by [facility]ES[facility] MD, Barbara CowerJASON [phone]) on 05/01/2015  4:55:43 PM      MDM   Final diagnoses:  Seizure (HCC)   Multiple seizures today. H/o same. Not on meds. Had a headache ahead of time. Drank beer last night.  Possible hyponatremia, tumor.  Slightly acidotic, likely 2/2 dehydration, improved with hydration. Potassium decreased likely 2/2 dilutional effect.  Discussed case with Neurology who recommended starting keppra and following up as an outpatient. Neuro exam unchanged.   Will also get K rechecked.   New Prescriptions: Discharge Medication List as of 05/01/2015  8:50 PM    START taking these medications   Details  levETIRAcetam (KEPPRA) 500 MG tablet Take 1 tablet (500 mg total) by mouth 2 (two) times daily., Starting 05/01/2015, Until Discontinued, Print    potassium chloride SA (K-DUR,KLOR-CON) 20 MEQ tablet Take 1 tablet (20 mEq total) by  mouth 2 (two) times daily., Starting 05/01/2015, Until Discontinued, Print        I have personally and contemperaneously reviewed labs and imaging and used in my decision making as above.   A medical screening exam was performed and I feel the patient has had an appropriate workup for their chief complaint at this time and likelihood of emergent condition existing is low. Their vital signs are stable. They have been counseled on decision, discharge, follow up and which symptoms necessitate immediate return to the emergency department.  They verbally stated understanding and agreement with plan and discharged in stable condition.      Marily Memos, MD 05/02/15 [phone]

## 2015-06-14 ENCOUNTER — Encounter (HOSPITAL_COMMUNITY): Payer: Self-pay | Admitting: Emergency Medicine

## 2015-06-14 ENCOUNTER — Emergency Department (HOSPITAL_COMMUNITY)
Admission: EM | Admit: 2015-06-14 | Discharge: 2015-06-15 | Disposition: A | Discharge status: Home / Self Care | Payer: No Typology Code available for payment source | Attending: Emergency Medicine | Admitting: Emergency Medicine

## 2015-06-14 ENCOUNTER — Emergency Department (HOSPITAL_COMMUNITY): Payer: No Typology Code available for payment source

## 2015-06-14 DIAGNOSIS — S29001A Unspecified injury of muscle and tendon of front wall of thorax, initial encounter: Secondary | ICD-10-CM | POA: Diagnosis not present

## 2015-06-14 DIAGNOSIS — Z79899 Other long term (current) drug therapy: Secondary | ICD-10-CM | POA: Insufficient documentation

## 2015-06-14 DIAGNOSIS — Y9389 Activity, other specified: Secondary | ICD-10-CM | POA: Diagnosis not present

## 2015-06-14 DIAGNOSIS — S8992XA Unspecified injury of left lower leg, initial encounter: Secondary | ICD-10-CM | POA: Diagnosis present

## 2015-06-14 DIAGNOSIS — S80811A Abrasion, right lower leg, initial encounter: Secondary | ICD-10-CM | POA: Insufficient documentation

## 2015-06-14 DIAGNOSIS — S299XXA Unspecified injury of thorax, initial encounter: Secondary | ICD-10-CM

## 2015-06-14 DIAGNOSIS — S99922A Unspecified injury of left foot, initial encounter: Secondary | ICD-10-CM | POA: Insufficient documentation

## 2015-06-14 DIAGNOSIS — S80812A Abrasion, left lower leg, initial encounter: Secondary | ICD-10-CM | POA: Diagnosis not present

## 2015-06-14 DIAGNOSIS — Y92410 Unspecified street and highway as the place of occurrence of the external cause: Secondary | ICD-10-CM | POA: Insufficient documentation

## 2015-06-14 DIAGNOSIS — F17210 Nicotine dependence, cigarettes, uncomplicated: Secondary | ICD-10-CM | POA: Insufficient documentation

## 2015-06-14 DIAGNOSIS — R300 Dysuria: Secondary | ICD-10-CM | POA: Insufficient documentation

## 2015-06-14 DIAGNOSIS — T07XXXA Unspecified multiple injuries, initial encounter: Secondary | ICD-10-CM

## 2015-06-14 DIAGNOSIS — Y998 Other external cause status: Secondary | ICD-10-CM | POA: Diagnosis not present
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
## 2015-06-15 MED ORDER — HYDROCODONE-ACETAMINOPHEN 5-325 MG PO TABS: 1.0000 | ORAL_TABLET | Freq: Four times a day (QID) | ORAL | Status: DC | PRN

## 2015-06-15 MED ORDER — NAPROXEN 500 MG PO TABS: 500.0000 mg | ORAL_TABLET | Freq: Two times a day (BID) | ORAL | Status: DC

## 2015-06-15 MED ORDER — HYDROCODONE-ACETAMINOPHEN 5-325 MG PO TABS
1.0000 | ORAL_TABLET | Freq: Once | ORAL | Status: AC
  Administered 2015-06-15: 1 via ORAL
  Filled 2015-06-15: qty 1
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
## 2015-06-15 NOTE — ED Notes (Signed)
Pt is in stable condition upon d/c and ambulates from ED. 

## 2016-10-23 ENCOUNTER — Emergency Department (HOSPITAL_COMMUNITY)
Admission: EM | Admit: 2016-10-23 | Discharge: 2016-10-23 | Disposition: A | Discharge status: Home / Self Care | Payer: Self-pay | Attending: Emergency Medicine | Admitting: Emergency Medicine

## 2016-10-23 ENCOUNTER — Encounter (HOSPITAL_COMMUNITY): Payer: Self-pay

## 2016-10-23 DIAGNOSIS — Z5321 Procedure and treatment not carried out due to patient leaving prior to being seen by health care provider: Secondary | ICD-10-CM | POA: Insufficient documentation

## 2016-10-23 DIAGNOSIS — R1084 Generalized abdominal pain: Secondary | ICD-10-CM | POA: Insufficient documentation

## 2016-10-23 NOTE — ED Triage Notes (Signed)
Reports of lower abdominal pain with dysuria and urinary frequency x 3-4 days. Denies n/v/d or fever.

## 2016-10-23 NOTE — ED Notes (Signed)
Patient to registation desk and stated he had a family emergency and had to leave. Patient had steady gait while walking out of department.

## 2016-10-23 NOTE — ED Triage Notes (Signed)
Pt called from waiting room with no answer 

## 2017-08-16 ENCOUNTER — Encounter: Payer: Self-pay | Admitting: Infectious Diseases

## 2017-10-11 ENCOUNTER — Encounter: Payer: Self-pay | Admitting: Infectious Diseases

## 2017-10-11 ENCOUNTER — Ambulatory Visit (INDEPENDENT_AMBULATORY_CARE_PROVIDER_SITE_OTHER): Payer: Self-pay | Admitting: Infectious Diseases

## 2017-10-11 VITALS — BP 112/68 | HR 99 | Temp 98.1°F | Ht 72.0 in | Wt 200.0 lb

## 2017-10-11 DIAGNOSIS — Z0000 Encounter for general adult medical examination without abnormal findings: Secondary | ICD-10-CM | POA: Insufficient documentation

## 2017-10-11 DIAGNOSIS — B182 Chronic viral hepatitis C: Secondary | ICD-10-CM | POA: Insufficient documentation

## 2017-10-11 HISTORY — DX: Chronic viral hepatitis C: B18.2

## 2017-10-11 NOTE — Progress Notes (Signed)
HPI: Stephen Mcpherson is a 27 y.o. male who presents to the [facility] clinic today to establish care with Stephen Mcpherson for his Hep C infection.  There are no active problems to display for this patient.   Patient's Medications  New Prescriptions   No medications on file  Previous Medications   HYDROCODONE-ACETAMINOPHEN (NORCO/VICODIN) 5-325 MG TABLET    Take 1-2 tablets by mouth every 6 (six) hours as needed for moderate pain.   LEVETIRACETAM (KEPPRA) 500 MG TABLET    Take 1 tablet (500 mg total) by mouth 2 (two) times daily.   NAPROXEN (NAPROSYN) 500 MG TABLET    Take 1 tablet (500 mg total) by mouth 2 (two) times daily.   POTASSIUM CHLORIDE SA (K-DUR,KLOR-CON) 20 MEQ TABLET    Take 1 tablet (20 mEq total) by mouth 2 (two) times daily.  Modified Medications   No medications on file  Discontinued Medications   No medications on file    Allergies: No Known Allergies  Past Medical History: Past Medical History:  Diagnosis Date  . Back pain   . ETOH abuse     Social History: Social History   Socioeconomic History  . Marital status: Single    Spouse name: Not on file  . Number of children: Not on file  . Years of education: Not on file  . Highest education level: Not on file  Occupational History  . Not on file  Social Needs  . Financial resource strain: Not on file  . Food insecurity:    Worry: Not on file    Inability: Not on file  . Transportation needs:    Medical: Not on file    Non-medical: Not on file  Tobacco Use  . Smoking status: Current Every Day Smoker    Packs/day: 1.00    Types: Cigarettes  . Smokeless tobacco: Never Used  Substance and Sexual Activity  . Alcohol use: No    Comment: socially  . Drug use: No  . Sexual activity: Not Currently    Birth control/protection: Condom  Lifestyle  . Physical activity:    Days per week: Not on file    Minutes per session: Not on file  . Stress: Not on file  Relationships  . Social connections:   Talks on phone: Not on file    Gets together: Not on file    Attends religious service: Not on file    Active member of club or organization: Not on file    Attends meetings of clubs or organizations: Not on file    Relationship status: Not on file  Other Topics Concern  . Not on file  Social History Narrative   ** Merged History Encounter **        Labs: Hepatitis C No results found for: HCVGENOTYPE, HEPCAB, HCVRNAPCRQN, FIBROSTAGE Hepatitis B No results found for: HEPBSAB, HEPBSAG, HEPBCAB Hepatitis A No results found for: HAV HIV No results found for: HIV Lab Results  Component Value Date   CREATININE 0.80 05/01/2015   CREATININE 1.22 05/01/2015   CREATININE 1.17 09/13/2014   Lab Results  Component Value Date   AST 37 09/13/2014   ALT 31 09/13/2014    Assessment: Stephen Mcpherson is here today to establish care with Stephen Mcpherson for his Hep C infection.  Stephen Mcpherson and I met with him today and explained the process of getting him started on medications.  He is uninsured, so Stephen Mcpherson will apply through [employer].  He filled out a Radio producer,  and I gave him the [facility] financial assistance application to fill out and mail in. Will follow-up with him once he is approved.   Plan: - Apply through [facility] once labs are back  Cassie L. Kuppelweiser, PharmD, [place], [place] [facility] 10/11/2017, 3:29 PM

## 2017-10-11 NOTE — Assessment & Plan Note (Signed)
Counseled/discussed pneumovax today - he will consider.
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
## 2017-10-11 NOTE — Assessment & Plan Note (Addendum)
New Patient with Chronic Hepatitis C genotype unknown, treatment naive.   I discussed with the patient the lab findings that confirm chronic hepatitis C as well as the natural history and progression of disease including about 30% of people who develop cirrhosis of the liver if left untreated and once cirrhosis is established there is a 2-7% risk per year of liver cancer and liver failure.  I discussed the importance of treatment and benefits in reducing the risk, even if significant liver fibrosis exists. I also discussed risk for re-infection following treatment should he not continue to modify risk factors.    Patient counseled extensively on limiting acetaminophen to no more than 2 grams daily, avoidance of alcohol.  Transmission discussed with patient including sexual transmission, sharing razors and toothbrush.   Will need referral to gastroenterology if concern for cirrhosis - no concerns on exam.   No need for substance abuse counseling.   Will prescribe appropriate medication based on genotype and coverage (Harvoni or Epclusa through [employer] [facility])  He has been given application for [facility] and discussed with our pharmacy team.   Hepatitis A and B titers to be drawn today with appropriate vaccinations as needed   Pneumovax vaccine discussed - he will consider.   Further work up to include liver staging with elastography  Will call him to review results and discuss plan for treatment once liver ultrasound/elastography is obtained.

## 2017-10-11 NOTE — Patient Instructions (Addendum)
Nice to meet you today!    We need to get a little more information about your hepatitis c infection before we start your treatment. I anticipate that we can get you started in a few weeks.    Until we can get you treated would recommend: - condoms with sexual encounters or abstinence - no sharing of razors, toothbrushes or anything that could potentially have blood on it.  - limit alcohol to as little as possible to less than 1 drink a day  - limit tylenol use to less than 2,000 mg daily    We will call you when we get all your results and the plan.   [facility] Financial Assistance - please fill this paper out we gave you today and start the application.    Pneumococcal Polysaccharide Vaccine: What You Need to Know 1. Why get vaccinated? Vaccination can protect older adults (and some children and younger adults) from pneumococcal disease. Pneumococcal disease is caused by bacteria that can spread from person to person through close contact. It can cause ear infections, and it can also lead to more serious infections of the:  Lungs (pneumonia),  Blood (bacteremia), and  Covering of the brain and spinal cord (meningitis). Meningitis can cause deafness and brain damage, and it can be fatal.  Anyone can get pneumococcal disease, but children under 25 years of age, people with certain medical conditions, adults over 15 years of age, and cigarette smokers are at the highest risk. About 18,000 older adults die each year from pneumococcal disease in the [place]. Treatment of pneumococcal infections with penicillin and other drugs used to be more effective. But some strains of the disease have become resistant to these drugs. This makes prevention of the disease, through vaccination, even more important. 2. Pneumococcal polysaccharide vaccine (PPSV23) Pneumococcal polysaccharide vaccine (PPSV23) protects against 23 types of pneumococcal bacteria. It will not prevent all pneumococcal  disease. PPSV23 is recommended for:  All adults 54 years of age and older,  Anyone 2 through 28 years of age with certain long-term health problems,  Anyone 2 through 28 years of age with a weakened immune system,  Adults 33 through 28 years of age who smoke cigarettes or have asthma.  Most people need only one dose of PPSV. A second dose is recommended for certain high-risk groups. People 60 and older should get a dose even if they have gotten one or more doses of the vaccine before they turned 65. Your healthcare provider can give you more information about these recommendations. Most healthy adults develop protection within 2 to 3 weeks of getting the shot. 3. Some people should not get this vaccine  Anyone who has had a life-threatening allergic reaction to PPSV should not get another dose.  Anyone who has a severe allergy to any component of PPSV should not receive it. Tell your provider if you have any severe allergies.  Anyone who is moderately or severely ill when the shot is scheduled may be asked to wait until they recover before getting the vaccine. Someone with a mild illness can usually be vaccinated.  Children less than 64 years of age should not receive this vaccine.  There is no evidence that PPSV is harmful to either a pregnant woman or to her fetus. However, as a precaution, women who need the vaccine should be vaccinated before becoming pregnant, if possible. 4. Risks of a vaccine reaction With any medicine, including vaccines, there is a chance of side effects. These  are usually mild and go away on their own, but serious reactions are also possible. About half of people who get PPSV have mild side effects, such as redness or pain where the shot is given, which go away within about two days. Less than 1 out of 100 people develop a fever, muscle aches, or more severe local reactions. Problems that could happen after any vaccine:  People sometimes faint after a medical  procedure, including vaccination. Sitting or lying down for about 15 minutes can help prevent fainting, and injuries caused by a fall. Tell your doctor if you feel dizzy, or have vision changes or ringing in the ears.  Some people get severe pain in the shoulder and have difficulty moving the arm where a shot was given. This happens very rarely.  Any medication can cause a severe allergic reaction. Such reactions from a vaccine are very rare, estimated at about 1 in a million doses, and would happen within a few minutes to a few hours after the vaccination. As with any medicine, there is a very remote chance of a vaccine causing a serious injury or death. The safety of vaccines is always being monitored. For more information, visit: http://floyd.org/www.cdc.gov/vaccinesafety/ 5. What if there is a serious reaction? What should I look for? Look for anything that concerns you, such as signs of a severe allergic reaction, very high fever, or unusual behavior. Signs of a severe allergic reaction can include hives, swelling of the face and throat, difficulty breathing, a fast heartbeat, dizziness, and weakness. These would usually start a few minutes to a few hours after the vaccination. What should I do? If you think it is a severe allergic reaction or other emergency that can't wait, call 9-1-1 or get to the nearest hospital. Otherwise, call your doctor. Afterward, the reaction should be reported to the Vaccine Adverse Event Reporting System (VAERS). Your doctor might file this report, or you can do it yourself through the VAERS web site at www.vaers.LAgents.nohhs.gov, or by calling 1-(573) 003-2594. VAERS does not give medical advice. 6. How can I learn more?  Ask your doctor. He or she can give you the vaccine package insert or suggest other sources of information.  Call your local or state health department.  Contact the Centers for Disease Control and Prevention (CDC): ? Call 717 384 64551-270-352-4943 (1-800-CDC-INFO) or ? Visit  CDC's website at PicCapture.uywww.cdc.gov/vaccines CDC Pneumococcal Polysaccharide Vaccine VIS (05/17/13) This information is not intended to replace advice given to you by your health care provider. Make sure you discuss any questions you have with your health care provider. Document Released: 11/07/2005 Document Revised: 10/01/2015 Document Reviewed: 10/01/2015 Elsevier Interactive Patient Education  2017 [employer]

## 2017-10-16 LAB — PROTIME-INR
INR: 0.9
Prothrombin Time: 9.9 s (ref 9.0–11.5)

## 2017-10-16 LAB — HIV ANTIBODY (ROUTINE TESTING W REFLEX): HIV 1&2 Ab, 4th Generation: NONREACTIVE

## 2017-10-16 LAB — LIVER FIBROSIS, FIBROTEST-ACTITEST
ALPHA-2-MACROGLOBULIN: 122 mg/dL (ref 106–279)
ALT: 153 U/L — ABNORMAL HIGH (ref 9–46)
Apolipoprotein A1: 186 mg/dL — ABNORMAL HIGH (ref 94–176)
Bilirubin: 0.4 mg/dL (ref 0.2–1.2)
Fibrosis Score: 0.03
GGT: 32 U/L (ref 3–70)
HAPTOGLOBIN: 123 mg/dL (ref 43–212)
NECROINFLAMMAT ACT SCORE: 0.64
REFERENCE ID: 2645877

## 2017-10-16 LAB — HEPATITIS A ANTIBODY, TOTAL: HEPATITIS A AB,TOTAL: NONREACTIVE

## 2017-10-16 LAB — HEPATITIS C GENOTYPE: HCV GENOTYPE: 2

## 2017-10-16 LAB — HEPATITIS C RNA QUANTITATIVE
HCV Quantitative Log: 4.96 Log IU/mL — ABNORMAL HIGH
HCV RNA, PCR, QN: 91200 [IU]/mL — AB

## 2017-10-16 LAB — HEPATITIS B CORE ANTIBODY, TOTAL: Hep B Core Total Ab: NONREACTIVE

## 2017-10-16 LAB — HEPATITIS B SURFACE ANTIGEN: Hepatitis B Surface Ag: NONREACTIVE

## 2017-10-16 LAB — HEPATITIS B SURFACE ANTIBODY,QUALITATIVE: Hep B S Ab: BORDERLINE — AB

## 2017-10-17 ENCOUNTER — Ambulatory Visit (HOSPITAL_COMMUNITY): Payer: Self-pay

## 2017-10-18 NOTE — Progress Notes (Signed)
Genotype 2 with VL 91,200. Awaiting for liver ultrasound. Metavir score F0 on serum panel indicating no evidence of fibrosis. He had to reschedule his liver ultrasound appointment and as soon as that is complete he understands that we can begin treatment once this is complete. Will start [facility] application process for treatment. He would prefer 8 week regimen with Mavyret but I explained that it would in part be dictated by available assistance. He understands and has no questions.

## 2017-11-09 ENCOUNTER — Ambulatory Visit (HOSPITAL_COMMUNITY)
Admission: RE | Admit: 2017-11-09 | Discharge: 2017-11-09 | Disposition: A | Discharge status: Home / Self Care | Payer: Self-pay | Source: Ambulatory Visit | Attending: Infectious Diseases | Admitting: Infectious Diseases

## 2017-11-09 DIAGNOSIS — B182 Chronic viral hepatitis C: Secondary | ICD-10-CM | POA: Insufficient documentation

## 2017-11-13 ENCOUNTER — Telehealth: Payer: Self-pay | Admitting: Behavioral Health

## 2017-11-13 NOTE — Progress Notes (Signed)
We need just a little more blood work before we can start his hep c treatment; can you please call Stephen Mcpherson to see if he can stop in this week for CMET and CBC. Orders are in. Thank you.

## 2017-11-13 NOTE — Telephone Encounter (Signed)
-----   Message from Blanchard Kelch, NP sent at 11/13/2017  2:20 PM EDT ----- We need just a little more blood work before we can start his hep c treatment; can you please call Matt to see if he can stop in this week for CMET and CBC. Orders are in. Thank you.

## 2017-11-13 NOTE — Telephone Encounter (Signed)
Called patient left generic voicemail for him to call the office back.  Patient needs lab appointment scheduled this week before HEP C treatment starts. Angeline Slim RN

## 2017-11-14 NOTE — Telephone Encounter (Signed)
Called patient left generic message for him to call the office back.  This is the second attempt to contact patient.  Angeline Slim RN

## 2017-12-04 ENCOUNTER — Telehealth: Payer: Self-pay | Admitting: *Deleted

## 2017-12-04 NOTE — Telephone Encounter (Signed)
My apologies, I've had a great number of    communications challenges due predominately to     my phone company and their network. I wouldnt    mind at all coming in on Friday. Thank you very    much for the prompt response. I'm looking    forward to our meeting.     Sincerely,        J.M.W.M.  ----- Message -----  From: Nurse Ritta Slot  Sent: 12/04/17 11:38 AM  To: Stephen Mcpherson  Subject: RE: Appointment Request    Hi, Stephen Mcpherson.  Judeth Cornfield tried to reach out to you to bring you in for a little more blood work. Would you like to see her this week?  She does not have an appointment tomorrow morning, however.   How about Friday, 11/15 at 9:45?  Marcelino Duster    ----- Message -----   From: Stephen Mcpherson   Sent: 12/04/2017 2:20 AM EST    To: Patient Appointment Schedule Request Mailing List  Subject: Appointment Request    Appointment Request From: Stephen Mcpherson    With Provider: Rexene Alberts, NP [facility]]    Preferred Date Range: 12/05/2017 - 12/05/2017    Preferred Times: Tuesday Morning    Reason for visit: Request an Appointment    Comments:  Follow up for treatment planning/exicuting in reference to my disease.
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
## 2017-12-08 ENCOUNTER — Ambulatory Visit: Payer: Self-pay | Admitting: Infectious Diseases
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
## 2018-03-06 ENCOUNTER — Ambulatory Visit: Payer: Self-pay | Admitting: Infectious Diseases

## 2018-12-24 ENCOUNTER — Other Ambulatory Visit: Payer: Self-pay

## 2018-12-24 DIAGNOSIS — Z20822 Contact with and (suspected) exposure to covid-19: Secondary | ICD-10-CM

## 2018-12-25 LAB — NOVEL CORONAVIRUS, NAA: SARS-CoV-2, NAA: NOT DETECTED

## 2019-06-07 ENCOUNTER — Emergency Department (HOSPITAL_COMMUNITY)
Admission: EM | Admit: 2019-06-07 | Discharge: 2019-06-07 | Disposition: A | Discharge status: Home / Self Care | Payer: Self-pay | Attending: Emergency Medicine | Admitting: Emergency Medicine

## 2019-06-07 ENCOUNTER — Encounter (HOSPITAL_COMMUNITY): Payer: Self-pay | Admitting: Emergency Medicine

## 2019-06-07 ENCOUNTER — Other Ambulatory Visit: Payer: Self-pay

## 2019-06-07 DIAGNOSIS — M545 Low back pain, unspecified: Secondary | ICD-10-CM

## 2019-06-07 DIAGNOSIS — F17210 Nicotine dependence, cigarettes, uncomplicated: Secondary | ICD-10-CM | POA: Insufficient documentation

## 2019-06-07 DIAGNOSIS — R112 Nausea with vomiting, unspecified: Secondary | ICD-10-CM | POA: Insufficient documentation

## 2019-06-07 DIAGNOSIS — N179 Acute kidney failure, unspecified: Secondary | ICD-10-CM | POA: Insufficient documentation

## 2019-06-07 LAB — URINALYSIS, ROUTINE W REFLEX MICROSCOPIC
Bilirubin Urine: NEGATIVE
Glucose, UA: NEGATIVE mg/dL
Hgb urine dipstick: NEGATIVE
Ketones, ur: NEGATIVE mg/dL
Leukocytes,Ua: NEGATIVE
Nitrite: NEGATIVE
Protein, ur: NEGATIVE mg/dL
Specific Gravity, Urine: 1.017 (ref 1.005–1.030)
pH: 7 (ref 5.0–8.0)

## 2019-06-07 LAB — COMPREHENSIVE METABOLIC PANEL
ALT: 118 U/L — ABNORMAL HIGH (ref 0–44)
AST: 52 U/L — ABNORMAL HIGH (ref 15–41)
Albumin: 4.2 g/dL (ref 3.5–5.0)
Alkaline Phosphatase: 51 U/L (ref 38–126)
Anion gap: 11 (ref 5–15)
BUN: 13 mg/dL (ref 6–20)
CO2: 26 mmol/L (ref 22–32)
Calcium: 11.1 mg/dL — ABNORMAL HIGH (ref 8.9–10.3)
Chloride: 100 mmol/L (ref 98–111)
Creatinine, Ser: 1.67 mg/dL — ABNORMAL HIGH (ref 0.61–1.24)
GFR calc Af Amer: >60 mL/min (ref >60)
GFR calc non Af Amer: 54 mL/min — ABNORMAL LOW (ref >60)
Glucose, Bld: 129 mg/dL — ABNORMAL HIGH (ref 70–99)
Potassium: 4.5 mmol/L (ref 3.5–5.1)
Sodium: 137 mmol/L (ref 135–145)
Total Bilirubin: 1.1 mg/dL (ref 0.3–1.2)
Total Protein: 7.9 g/dL (ref 6.5–8.1)

## 2019-06-07 LAB — CBC WITH DIFFERENTIAL/PLATELET
Abs Immature Granulocytes: 0.05 10*3/uL (ref 0.00–0.07)
Basophils Absolute: 0.1 10*3/uL (ref 0.0–0.1)
Basophils Relative: 1 %
Eosinophils Absolute: 0.2 10*3/uL (ref 0.0–0.5)
Eosinophils Relative: 1 %
HCT: 51.2 % (ref 39.0–52.0)
Hemoglobin: 17.7 g/dL — ABNORMAL HIGH (ref 13.0–17.0)
Immature Granulocytes: 0 %
Lymphocytes Relative: 12 %
Lymphs Abs: 1.4 10*3/uL (ref 0.7–4.0)
MCH: 33.5 pg (ref 26.0–34.0)
MCHC: 34.6 g/dL (ref 30.0–36.0)
MCV: 97 fL (ref 80.0–100.0)
Monocytes Absolute: 0.9 10*3/uL (ref 0.1–1.0)
Monocytes Relative: 7 %
Neutro Abs: 9.6 10*3/uL — ABNORMAL HIGH (ref 1.7–7.7)
Neutrophils Relative %: 79 %
Platelets: 249 10*3/uL (ref 150–400)
RBC: 5.28 MIL/uL (ref 4.22–5.81)
RDW: 12.3 % (ref 11.5–15.5)
WBC: 12.2 10*3/uL — ABNORMAL HIGH (ref 4.0–10.5)
nRBC: 0 % (ref 0.0–0.2)

## 2019-06-07 MED ORDER — ONDANSETRON 4 MG PO TBDP
8.0000 mg | ORAL_TABLET | Freq: Once | ORAL | Status: AC
  Administered 2019-06-07: 8 mg via ORAL
  Filled 2019-06-07: qty 2

## 2019-06-07 MED ORDER — SODIUM CHLORIDE 0.9 % IV BOLUS
2000.0000 mL | Freq: Once | INTRAVENOUS | Status: AC
  Administered 2019-06-07: 2000 mL via INTRAVENOUS

## 2019-06-07 MED ORDER — ONDANSETRON 8 MG PO TBDP
8.0000 mg | ORAL_TABLET | Freq: Three times a day (TID) | ORAL | 0 refills | Status: DC | PRN
Start: 2019-06-07 — End: 2022-07-02

## 2019-06-07 MED ORDER — CYCLOBENZAPRINE HCL 10 MG PO TABS
10.0000 mg | ORAL_TABLET | Freq: Once | ORAL | Status: AC
  Administered 2019-06-07: 10 mg via ORAL
  Filled 2019-06-07: qty 1

## 2019-06-07 NOTE — Discharge Instructions (Signed)
Your kidney function was increased today with creatinine 1.67.  This may be due to volume depletion, but is unclear if he has some chronic component. Please drink plenty of clear liquids.  You are given medicine for nausea. You will need to have your kidney function rechecked next week. If you are worse today time please return to the emergency department for reevaluation.

## 2019-06-07 NOTE — ED Triage Notes (Signed)
Patient reports left flank pain for 1 week worse when lying .changing positions and movement , denies dysuria or hematuria , patient added emesis and diarrhea . No fever or chills .

## 2019-06-07 NOTE — ED Provider Notes (Addendum)
[facility] EMERGENCY DEPARTMENT Provider Note   CSN: [id number] Arrival date & time: 06/07/19  6295     History Chief Complaint  Patient presents with  . Flank Pain    Stephen Mcpherson is a 29 y.o. male.  HPI    29 year old male history of back pain, chronic hep C, EtOH abuse presents today complaining of left-sided low back pain that began while bowling a week ago.  No known direct trauma.  Pain does not radiate.  Remains in the mid left back area.  Is worse with moving.  He is also complaining of nausea and vomiting that began yesterday.  He states he vomited multiple times and is now able to keep down half of the ginger ale.  He states that he feels that he may be somewhat dehydrated.  He had some subjective fever and chills.  No known sick contacts.  Denies diarrhea, bilious vomiting, bright red blood per rectum, coffee-ground emesis or bright red emesis. Past Medical History:  Diagnosis Date  . Back pain   . Chronic hepatitis C without hepatic coma (HCC) 10/11/2017  . ETOH abuse     Patient Active Problem List   Diagnosis Date Noted  . Chronic hepatitis C without hepatic coma (HCC) 10/11/2017  . Healthcare maintenance 10/11/2017    Past Surgical History:  Procedure Laterality Date  . NO PAST SURGERIES         Family History  Problem Relation Age of Onset  . Other Mother        benigh brain tumor   . COPD Maternal Grandmother   . Liver disease Maternal Grandfather   . Heart disease Maternal Grandfather   . Kidney disease Maternal Grandfather   . Diabetes Paternal Grandfather     Social History   Tobacco Use  . Smoking status: Current Every Day Smoker    Packs/day: 1.00    Types: Cigarettes  . Smokeless tobacco: Never Used  Substance Use Topics  . Alcohol use: No    Comment: socially  . Drug use: No    Home Medications Prior to Admission medications   Not on File    Allergies    Patient has no known allergies.  Review  of Systems   Review of Systems  All other systems reviewed and are negative.   Physical Exam Updated Vital Signs BP (!) 130/95   Pulse 82   Temp 98.3 F (36.8 C) (Oral)   Resp 15   Ht 1.854 m (6\' 1" )   Wt 108.9 kg   SpO2 97%   BMI 31.66 kg/m   Physical Exam Vitals and nursing note reviewed.  Constitutional:      General: He is not in acute distress.    Appearance: Normal appearance. He is well-developed.  HENT:     Head: Normocephalic and atraumatic.     Right Ear: External ear normal.     Left Ear: External ear normal.     Nose: Nose normal.  Eyes:     Conjunctiva/sclera: Conjunctivae normal.     Pupils: Pupils are equal, round, and reactive to light.  Cardiovascular:     Rate and Rhythm: Normal rate and regular rhythm.     Heart sounds: Normal heart sounds.  Pulmonary:     Effort: Pulmonary effort is normal.     Breath sounds: Normal breath sounds.  Abdominal:     General: Bowel sounds are normal.     Palpations: Abdomen is soft.  Musculoskeletal:  General: Normal range of motion.       Arms:     Cervical back: Normal range of motion and neck supple.  Skin:    General: Skin is warm and dry.     Capillary Refill: Capillary refill takes less than 2 seconds.  Neurological:     Mental Status: He is alert and oriented to person, place, and time.     Deep Tendon Reflexes: Reflexes are normal and symmetric.  Psychiatric:        Behavior: Behavior normal.        Thought Content: Thought content normal.        Judgment: Judgment normal.     ED Results / Procedures / Treatments   Labs (all labs ordered are listed, but only abnormal results are displayed) Labs Reviewed  CBC WITH DIFFERENTIAL/PLATELET - Abnormal; Notable for the following components:      Result Value   WBC 12.2 (*)    Hemoglobin 17.7 (*)    Neutro Abs 9.6 (*)    All other components within normal limits  COMPREHENSIVE METABOLIC PANEL - Abnormal; Notable for the following components:    Glucose, Bld 129 (*)    Creatinine, Ser 1.67 (*)    Calcium 11.1 (*)    AST 52 (*)    ALT 118 (*)    GFR calc non Af Amer 54 (*)    All other components within normal limits  URINALYSIS, ROUTINE W REFLEX MICROSCOPIC    EKG None  Radiology No results found.  Procedures Procedures (including critical care time)  Medications Ordered in ED Medications  ondansetron (ZOFRAN-ODT) disintegrating tablet 8 mg (8 mg Oral Given 06/07/19 0954)  cyclobenzaprine (FLEXERIL) tablet 10 mg (10 mg Oral Given 06/07/19 0954)  sodium chloride 0.9 % bolus 2,000 mL (2,000 mLs Intravenous New Bag/Given 06/07/19 1001)    ED Course  I have reviewed the triage vital signs and the nursing notes.  Pertinent labs & imaging results that were available during my care of the patient were reviewed by me and considered in my medical decision making (see chart for details).    MDM Rules/Calculators/A&P                      Well-developed well-nourished male presents today complaining of left-sided back pain.  Pain began while bowling.  Appears to musculoskeletal in nature.  He has tenderness but noted point tenderness to indicate fracture.  Urine is clear with no evidence of red blood cells.  He does not have any acute neurological deficits or red flags that would indicate we need imaging or further evaluation of the back pain.  He is given Flexeril here for back pain Patient has had nausea and vomiting.  He has normal heart rate and blood pressure.  However, his creatinine is elevated from first prior.  Creatinine today is 1.67.  BUN is normal.  First prior creatinine of 05/01/2015 was 0.8.  Earlier in 2017 his creatinine was 1.22.  Due to this abnormality, patient was given IV fluids.  Was given p.o. Zofran and was having oral fluid challenge.  Plan, likely discharge as long as tolerating oral fluids.  Patient will need follow-up and recheck of creatinine. Patient did not want to receive second liter fluid.  He is  tolerating fluids now well.  I have discussed all findings and need for follow-up with patient and he voices understanding of follow-up needs and return precautions Final Clinical Impression(s) / ED Diagnoses Final  diagnoses:  Acute left-sided low back pain without sciatica  Non-intractable vomiting with nausea, unspecified vomiting type  AKI (acute kidney injury) [facility])    Rx / DC Orders ED Discharge Orders    None       Margarita Grizzle, MD 06/07/19 1103    Margarita Grizzle, MD 06/07/19 [phone]

## 2019-06-17 ENCOUNTER — Telehealth: Payer: Self-pay | Admitting: *Deleted

## 2019-06-17 NOTE — Telephone Encounter (Signed)
Pt called regarding Rx for muscle relaxant. RNCM reviewed chart and did not find that pt had Rx written on last ED visit.  Advised pt to visit PCP office if he is in need of Rx.

## 2022-07-02 ENCOUNTER — Encounter (HOSPITAL_COMMUNITY): Payer: Self-pay | Admitting: *Deleted

## 2022-07-02 ENCOUNTER — Other Ambulatory Visit: Payer: Self-pay

## 2022-07-02 ENCOUNTER — Emergency Department (HOSPITAL_COMMUNITY)
Admission: EM | Admit: 2022-07-02 | Discharge: 2022-07-02 | Disposition: A | Discharge status: Home / Self Care | Payer: Self-pay | Attending: Emergency Medicine | Admitting: Emergency Medicine

## 2022-07-02 DIAGNOSIS — R112 Nausea with vomiting, unspecified: Secondary | ICD-10-CM | POA: Insufficient documentation

## 2022-07-02 DIAGNOSIS — Z8659 Personal history of other mental and behavioral disorders: Secondary | ICD-10-CM | POA: Insufficient documentation

## 2022-07-02 DIAGNOSIS — R000 Tachycardia, unspecified: Secondary | ICD-10-CM | POA: Insufficient documentation

## 2022-07-02 DIAGNOSIS — Z87898 Personal history of other specified conditions: Secondary | ICD-10-CM

## 2022-07-02 LAB — CBC WITH DIFFERENTIAL/PLATELET
Abs Immature Granulocytes: 0.05 10*3/uL (ref 0.00–0.07)
Basophils Absolute: 0.1 10*3/uL (ref 0.0–0.1)
Basophils Relative: 0 %
Eosinophils Absolute: 0.1 10*3/uL (ref 0.0–0.5)
Eosinophils Relative: 0 %
HCT: 43.7 % (ref 39.0–52.0)
Hemoglobin: 15.4 g/dL (ref 13.0–17.0)
Immature Granulocytes: 0 %
Lymphocytes Relative: 16 %
Lymphs Abs: 2.1 10*3/uL (ref 0.7–4.0)
MCH: 33.3 pg (ref 26.0–34.0)
MCHC: 35.2 g/dL (ref 30.0–36.0)
MCV: 94.6 fL (ref 80.0–100.0)
Monocytes Absolute: 0.8 10*3/uL (ref 0.1–1.0)
Monocytes Relative: 6 %
Neutro Abs: 9.8 10*3/uL — ABNORMAL HIGH (ref 1.7–7.7)
Neutrophils Relative %: 78 %
Platelets: 235 10*3/uL (ref 150–400)
RBC: 4.62 MIL/uL (ref 4.22–5.81)
RDW: 12.3 % (ref 11.5–15.5)
WBC: 12.9 10*3/uL — ABNORMAL HIGH (ref 4.0–10.5)
nRBC: 0 % (ref 0.0–0.2)

## 2022-07-02 LAB — COMPREHENSIVE METABOLIC PANEL
ALT: 52 U/L — ABNORMAL HIGH (ref 0–44)
AST: 32 U/L (ref 15–41)
Albumin: 4.1 g/dL (ref 3.5–5.0)
Alkaline Phosphatase: 43 U/L (ref 38–126)
Anion gap: 10 (ref 5–15)
BUN: 9 mg/dL (ref 6–20)
CO2: 25 mmol/L (ref 22–32)
Calcium: 8.8 mg/dL — ABNORMAL LOW (ref 8.9–10.3)
Chloride: 98 mmol/L (ref 98–111)
Creatinine, Ser: 0.97 mg/dL (ref 0.61–1.24)
GFR, Estimated: >60 mL/min (ref >60)
Glucose, Bld: 102 mg/dL — ABNORMAL HIGH (ref 70–99)
Potassium: 3.7 mmol/L (ref 3.5–5.1)
Sodium: 133 mmol/L — ABNORMAL LOW (ref 135–145)
Total Bilirubin: 1 mg/dL (ref 0.3–1.2)
Total Protein: 7.5 g/dL (ref 6.5–8.1)

## 2022-07-02 LAB — LIPASE, BLOOD: Lipase: 33 U/L (ref 11–51)

## 2022-07-02 MED ORDER — SODIUM CHLORIDE 0.9 % IV BOLUS
1000.0000 mL | Freq: Once | INTRAVENOUS | Status: AC
  Administered 2022-07-02: 1000 mL via INTRAVENOUS

## 2022-07-02 MED ORDER — ONDANSETRON HCL 4 MG/2ML IJ SOLN
4.0000 mg | Freq: Once | INTRAMUSCULAR | Status: DC
  Filled 2022-07-02: qty 2

## 2022-07-02 MED ORDER — ONDANSETRON HCL 4 MG PO TABS: 4.0000 mg | ORAL_TABLET | Freq: Four times a day (QID) | ORAL | 0 refills | Status: AC | PRN

## 2022-07-02 NOTE — ED Triage Notes (Signed)
Pt believes he may be dehydrated, drank a lot of alcohol yesterday and last night, emesis today.  Pt with c/o sweating and elevated HR.  Some diarrhea.

## 2022-07-02 NOTE — ED Provider Notes (Signed)
[place] EMERGENCY DEPARTMENT AT [facility] Provider Note   CSN: [id number] Arrival date & time: 07/02/22  1845     History  Chief Complaint  Patient presents with   Emesis    Stephen Mcpherson is a 32 y.o. male presenting for evaluation of nausea and vomiting and probable dehydration, stating he drank a lot of alcohol yesterday evening and suspects his symptoms are secondary to this consumption.  He does typically drink 2-3 times per week, last night was little bit more than normal, stating he was drinking stronger liquor than he thought he was consuming, woke with significant persistent nausea and vomiting today.  He also states his heart rate was high and he had some sweating episodes today which he related to the alcohol.  He denies abdominal pain, headache, dizziness.  He has had some diarrhea today.  The history is provided by the patient.       Home Medications Prior to Admission medications   Medication Sig Start Date End Date Taking? Authorizing Provider  ondansetron (ZOFRAN) 4 MG tablet Take 1 tablet (4 mg total) by mouth every 6 (six) hours as needed for nausea or vomiting. 07/02/22  Yes IdolRaynelle Fanning, PA-C      Allergies    Patient has no known allergies.    Review of Systems   Review of Systems  Constitutional:  Positive for diaphoresis. Negative for chills and fever.  HENT:  Negative for congestion and sore throat.   Eyes: Negative.   Respiratory:  Negative for chest tightness and shortness of breath.   Cardiovascular:  Positive for palpitations. Negative for chest pain.  Gastrointestinal:  Positive for diarrhea, nausea and vomiting. Negative for abdominal pain.  Genitourinary: Negative.   Musculoskeletal:  Negative for arthralgias, joint swelling and neck pain.  Skin: Negative.  Negative for rash and wound.  Neurological:  Negative for dizziness, weakness, light-headedness, numbness and headaches.  Psychiatric/Behavioral: Negative.    All other  systems reviewed and are negative.   Physical Exam Updated Vital Signs BP 121/84 (BP Location: Left Arm)   Pulse 86   Temp 98.3 F (36.8 C)   Resp 17   Ht 6\' 1"  (1.854 m)   Wt 104.3 kg   SpO2 98%   BMI 30.34 kg/m  Physical Exam Vitals and nursing note reviewed.  Constitutional:      Appearance: He is well-developed.  HENT:     Head: Normocephalic and atraumatic.     Mouth/Throat:     Mouth: Mucous membranes are dry.  Eyes:     Conjunctiva/sclera: Conjunctivae normal.  Cardiovascular:     Rate and Rhythm: Normal rate and regular rhythm.     Heart sounds: Normal heart sounds.  Pulmonary:     Effort: Pulmonary effort is normal.     Breath sounds: Normal breath sounds. No wheezing.  Abdominal:     General: Bowel sounds are normal.     Palpations: Abdomen is soft.     Tenderness: There is no abdominal tenderness. There is no guarding.  Musculoskeletal:        General: Normal range of motion.     Cervical back: Normal range of motion.  Skin:    General: Skin is warm and dry.  Neurological:     Mental Status: He is alert.     ED Results / Procedures / Treatments   Labs (all labs ordered are listed, but only abnormal results are displayed) Labs Reviewed  CBC WITH DIFFERENTIAL/PLATELET - Abnormal;  Notable for the following components:      Result Value   WBC 12.9 (*)    Neutro Abs 9.8 (*)    All other components within normal limits  COMPREHENSIVE METABOLIC PANEL - Abnormal; Notable for the following components:   Sodium 133 (*)    Glucose, Bld 102 (*)    Calcium 8.8 (*)    ALT 52 (*)    All other components within normal limits  LIPASE, BLOOD    EKG None  Radiology No results found.  Procedures Procedures    Medications Ordered in ED Medications  ondansetron [facility]) injection 4 mg (0 mg Intravenous Hold 07/02/22 2005)  sodium chloride 0.9 % bolus 1,000 mL (1,000 mLs Intravenous New Bag/Given 07/02/22 2005)    ED Course/ Medical Decision Making/  A&P                             Medical Decision Making Presenting with nausea and vomiting after heavy EtOH intake last night, had some diaphoresis today without documented fevers and is afebrile here.  No abdominal pain, reported tachycardia but not upon presentation here.  He was given IV fluids offered Zofran but he declined stating he just wanted IV fluids.  He felt much improved at time of discharge and states he was hungry and wanted to leave so he could eat.  He was given Zofran prescription for as needed use.  Amount and/or Complexity of Data Reviewed Labs: ordered.    Details: Labs reviewed and are as expected, he does have a mild bump in his ALT at 52, his AST and his lipase are normal.  He does have a WBC count of 12.9, probably acute reaction to the vomiting.  His electrolytes are fairly normal, mild hyponatremia at 133 from the vomiting.  This was replaced with a saline given.  Risk Prescription drug management.           Final Clinical Impression(s) / ED Diagnoses Final diagnoses:  Nausea and vomiting, unspecified vomiting type  History of alcohol use    Rx / DC Orders ED Discharge Orders          Ordered    ondansetron (ZOFRAN) 4 MG tablet  Every 6 hours PRN        07/02/22 2105              Burgess Amor, Cordelia Poche 07/02/22 2113    Bethann Berkshire, MD 07/05/22 1249

## 2022-07-02 NOTE — Discharge Instructions (Addendum)
Take the Zofran prescribed as needed for any return of your nausea or vomiting.  Make sure you are drinking plenty of fluids.

## 2022-07-02 NOTE — ED Notes (Signed)
Patient was given a cup of water. Patient stated he has not vomited since around 10 am and has been drinking water throughout the with no issues. Patient had a half empty bottle of water at bedside.

## 2022-09-23 NOTE — ED Provider Notes (Signed)
 " [facility]  ED Provider Note  Stephen Mcpherson 32 y.o. male DOB: 1990/11/11 MRN: [id number] History   Chief Complaint  Patient presents with   Detox Evaluation    Was 3 days sober and went drinking last night, also took pain pills with the alcohol. Has been having anxiety, chest pain, lightheaded, and dizziness. Hx of seizures. Requesting detox help   32 year old male presents the emergency department for evaluation.  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  He was seen in detox center at 4 days ago and prescribed clonidine and Zofran .  He had been doing well with those medications until yesterday.  He states he has been extremely stressed as his mother is sick.  Yesterday, he decided to use an opioid pain pill which did not make him feel better so he huffed electronics duster followed by drinking alcohol.  Patient reports diffuse myalgias, fatigue, nausea, diarrhea, anxiety, sweats, lightheadedness.      Past Medical History:  Diagnosis Date   Seizures (*)     No past surgical history on file.  Social History   Substance and Sexual Activity  Alcohol Use None   Social History   Tobacco Use  Smoking Status Not on file  Smokeless Tobacco Not on file   E-Cigarettes   Vaping Use     Start Date     Cartridges/Day     Quit Date     Social History   Substance and Sexual Activity  Drug Use Not on file         No Known Allergies  There are no discharge medications for this patient.   Review of Systems   Review of Systems  Constitutional:  Positive for appetite change and fatigue. Negative for chills and fever.  HENT:  Negative for congestion and rhinorrhea.   Eyes:  Negative for visual disturbance.  Respiratory:  Negative for cough and shortness of breath.   Gastrointestinal:  Positive for diarrhea and nausea. Negative for vomiting.  Endocrine: Negative for polydipsia and polyuria.  Genitourinary:  Negative for difficulty  urinating and dysuria.  Musculoskeletal:  Positive for arthralgias and myalgias.  Skin:  Negative for rash and wound.  Neurological:  Positive for weakness. Negative for numbness.  Psychiatric/Behavioral:  Negative for suicidal ideas.     Physical Exam   ED Triage Vitals [09/23/22 1332]  BP (!) 165/106  Heart Rate 76  Resp 20  SpO2 100 %  Temp 98.2 F (36.8 C)    Physical Exam  Vitals reviewed. Constitutional: He no respiratory distress.  Frequently yawning   HENT:  Head: Normocephalic.  Mouth/Throat: Voice normal.  Eyes: Pupils are equal, round, and reactive to light. Right eye: no drainage. no conjunctival injection. Left eye: no drainage. no conjunctival injection.  Neck: Voice normal. Neck supple.  Cardiovascular: Normal rate and intact distal pulses.  Pulmonary/Chest: No respiratory distress. Good air movement. Not tachypneic. Respiratory effort normal. No wheezing. No rhonchi. No rales. Abdominal: Soft. There is no abdominal tenderness. There is no guarding and no rebound. Abdomen not distended.  Musculoskeletal: No obvious deformity noted to extremities.     Cervical back: Neck supple.   Neurological: He is alert.  Skin: Skin is warm. Skin is dry. No rash noted.  Psychiatric:  Appears anxious    ED Course   Lab results:   COMPREHENSIVE METABOLIC PANEL - Abnormal      Result Value   Na 135 (*)    Potassium 4.2  Cl 101     CO2 28     AGAP 6 (*)    Glucose 128 (*)    BUN 3 (*)    Creatinine 0.84     Ca 9.3     ALK PHOS 64     T Bili 0.63     Total Protein 6.9     Alb 4.3     GLOBULIN 2.6     ALBUMIN/GLOBULIN RATIO 1.7     BUN/CREAT RATIO 3.6 (*)    ALT 28     AST 18     eGFR 118     Comment: Normal GFR (glomerular filtration rate) > 60 mL/min/1.73 meters squared, < 60 may include impaired kidney function. Calculation based on the Chronic Kidney Disease Epidemiology Collaboration (CK-EPI)equation refit without adjustment for race.  URINE DRUGS  OF ABUSE SCRN - Abnormal   Ur PH DOA Scr 7.5     Amphet Scr Negative     Barb Scr Negative     Benzo Scr Negative     Cannab Scr Positive (*)    Cocaine Scr Negative     Opiates Scr Negative     Meth Scr Negative     Oxyco Scr Positive (*)    Fentanyl Scr Positive (*)    Buprenorphine Screen Negative     Narrative:    Please Note Detection Levels Below:                            Amphetamines                    1000 ng/mL  Barbiturates                    200 ng/mL  Benzodiazepines                 200 ng/mL  Cannabinoids (Marijuana, THC)   50 ng/mL  Cocaine                         300 ng/mL  Opiates                         300 ng/mL  Methadone                       300 ng/mL  Oxycodone                        100 ng/mL  Fentanyl                          5 ng/mL  Buprenorphine                     5 ng/mL  This test is a screening test and results are only to be used for medical purposes.  If confirmation of positive results are needed, please order confirmation by GC/MS for each drug that needs confirmation.  Urine specimens are retained for 5 days.   SALICYLATE LEVEL - Abnormal   Salicylate <3.0 (*)   ETHANOL - Normal   Ethanol <10     Comment: Blood Alcohol Level is for Medical Purposes Only.  CBC AND DIFFERENTIAL   WBC 10.0     RBC 4.74     HGB 15.1  HCT 42.9     MCV 90.5     MCH 31.9     MCHC 35.2     Plt Ct 228     RDW SD 42.3     MPV 9.6     NRBC% 0.0     Absolute NRBC Count 0.00     NEUTROPHIL % 71.5     LYMPHOCYTE % 19.4     MONOCYTE % 8.0     Eosinophil % 0.3     BASOPHIL % 0.5     IG% 0.3     ABSOLUTE NEUTROPHIL COUNT 7.13     ABSOLUTE LYMPHOCYTE COUNT 1.93     Absolute Monocyte Count 0.80     Absolute Eosinophil Count 0.03     Absolute Basophil Count 0.05     Absolute Immature Granulocyte Count 0.03      Imaging: No data to display  ECG: ECG Results          ECG 12 lead (Final result)  Result time 09/23/22 21:14:33    Final  result             Narrative:   Diagnosis Class Borderline Abnormal Acquisition Device D3K Ventricular Rate 65 Atrial Rate 65 P-R Interval 136 QRS Duration 110 Q-T Interval 420 QTC Calculation(Bazett) 436 Calculated P Axis 62 Calculated R Axis 97 Calculated T Axis 71  Diagnosis Normal sinus rhythm Rightward axis Borderline ECG No previous ECGs available Leisa Mt ([id number]) on 09/23/2022 9:14:25 PM certifies that he/she has reviewed the ECG tracing and confirms the independent  interpretation is correct.                                                 Pre-Sedation Procedures  ED Course as of 09/24/22 2110  [facility]ell [facility] Documentation  Fri Sep 23, 2022  1851 Patient was evaluated by behavioral health.  Patient declined inpatient detox and is requesting to go home.  No SI.  No HI.   Medical Decision Making Amount and/or Complexity of Data Reviewed Labs: ordered. ECG/medicine tests: ordered.  Risk OTC drugs. Prescription drug management.   Differential diagnosis includes not limited to opioid withdrawal, polysubstance abuse, anxiety, depression, mood disorder, substance-induced mood disorder.  CBC demonstrates normal white blood cell count normal hemoglobin.  CMP showed mostly normal BUN and creatinine, normal ALT and AST.  Ethanol negative.  UDS positive for cannabinoids, oxycodone , fentanyl.  Patient evaluated by behavioral health.  Patient is currently established with an outpatient detox.  Patient ultimately decided he did not want to participate in behavioral health evaluation and requested discharge from the ED.  Grandmother present to drive patient home.  Patient will continue taking the clonidine and Zofran  as previously prescribed.  Return precautions discussed.  All questions answered to satisfaction.  Patient discharged in stable condition.     Provider Communication  There are no discharge medications for this  patient.   There are no discharge medications for this patient.   There are no discharge medications for this patient.   Clinical Impression Final diagnoses:  Polysubstance abuse (*)    ED Disposition     ED Disposition  Discharge   Condition  Stable   Comment  --                 Follow-up Information     [facility]. Schedule an appointment as soon as possible for a visit in 2 days.   Contact information: [address] [place]  [phone]                 Electronically signed by:    Powell JONETTA Bellman, DO 09/24/22 2110  "

## 2022-12-24 NOTE — Progress Notes (Addendum)
 Subjective Patient ID: Stephen Mcpherson is a 32 y.o. male.  Melvern Ramone is a 32 y.o. male presents for concerns of cough and congestion x 2 weeks, symptoms consistent. Lots of nasal drainage. Right sided sinus pressure    History provided by:  Patient Language interpreter used: No     Review of Systems  HENT:  Positive for congestion and postnasal drip.   Respiratory:  Positive for cough.     Patient History  Allergies: No Known Allergies   History reviewed. No pertinent past medical history. History reviewed. No pertinent surgical history. Social History   Socioeconomic History   Marital status: Not on file    Spouse name: Not on file   Number of children: Not on file   Years of education: Not on file   Highest education level: Not on file  Occupational History   Not on file  Tobacco Use   Smoking status: Every Day    Types: Cigarettes   Smokeless tobacco: Not on file  Substance and Sexual Activity   Alcohol use: Yes    Comment: occ   Drug use: Not on file   Sexual activity: Not on file  Other Topics Concern   Not on file  Social History Narrative   Not on file   History reviewed. No pertinent family history. No current outpatient medications on file prior to visit.   No current facility-administered medications on file prior to visit.     Objective  Vitals:   12/24/22 1420  BP: 109/61  Pulse: 76  Resp: 20  Temp: 36.8 C (98.2 F)  TempSrc: Oral  SpO2: 97%  Weight: 101 kg  Height: 6' 1                No results found.  Physical Exam Vitals and nursing note reviewed.  Constitutional:      General: He is not in acute distress.    Appearance: Normal appearance.  HENT:     Right Ear: Tympanic membrane normal.     Left Ear: Tympanic membrane normal.     Nose: Rhinorrhea present.     Mouth/Throat:     Pharynx: Posterior oropharyngeal erythema present.     Comments: Post nasal drip noted, mild erythema Eyes:      Extraocular Movements: Extraocular movements intact.     Conjunctiva/sclera: Conjunctivae normal.     Pupils: Pupils are equal, round, and reactive to light.  Cardiovascular:     Rate and Rhythm: Normal rate and regular rhythm.     Pulses: Normal pulses.     Heart sounds: Normal heart sounds.  Pulmonary:     Effort: Pulmonary effort is normal.     Breath sounds: Normal breath sounds.  Musculoskeletal:        General: Normal range of motion.     Cervical back: Normal range of motion and neck supple. No tenderness.  Lymphadenopathy:     Cervical: No cervical adenopathy.  Skin:    General: Skin is warm.     Capillary Refill: Capillary refill takes less than 2 seconds.  Neurological:     General: No focal deficit present.     Mental Status: He is alert.  Psychiatric:        Mood and Affect: Mood normal.        Thought Content: Thought content normal.        Judgment: Judgment normal.      Results for orders placed or performed in visit on 12/24/22  POCT QuickVue Antigen Test  Component Result   Rapid Covid Negative   Internal Quality Control Pass  POCT RAPID INFLUENZA MCKESSON  Component Result   Rapid Influenza A Ag Negative   Rapid Influenza B Ag Negative   Internal Quality Control Pass  POCT rapid strep A manually resulted  Component Result   Rapid Strep A Screen Negative   Internal Quality Control Pass     Procedures MDM:     1 Acute, uncomplicated illness or injury     Assessment requiring historian other than patient: No     Independent visualization of image, tracing, or test: No     Discussion of management with another provider: No     Risk:: Moderate          Assessment/Plan Diagnoses and all orders for this visit:  Sinusitis -     ibuprofen 600 MG tablet; Take 1 tablet (600 mg total) by mouth in the morning and 1 tablet (600 mg total) in the evening and 1 tablet (600 mg total) before bedtime. Do all this for 10 days. -     ipratropium (Atrovent)  0.06 % nasal spray; Administer 2 sprays into each nostril in the morning and 2 sprays at noon and 2 sprays in the evening and 2 sprays before bedtime.  Cough -     benzonatate (Tessalon Perles) 100 MG capsule; T 1-2 tab po tid prn  Other orders -     POCT QuickVue Antigen Test -     POCT RAPID INFLUENZA MCKESSON -     POCT rapid strep A manually resulted -     amoxicillin (Amoxil) 500 MG capsule; Take 2 capsules (1,000 mg total) by mouth every 12 (twelve) hours for 10 days.      Disposition Status: Home  Progress note signed by Debby Rung, PA on 12/24/22 at  4:39 PM
